# Patient Record
Sex: Male | Born: 1970
Health system: Southern US, Community
[De-identification: ages and names within clinical notes are randomized; demographics above are authoritative.]

## PROBLEM LIST (undated history)

## (undated) DIAGNOSIS — H349 Unspecified retinal vascular occlusion: Secondary | ICD-10-CM

## (undated) DIAGNOSIS — Z8701 Personal history of pneumonia (recurrent): Secondary | ICD-10-CM

## (undated) DIAGNOSIS — R7401 Elevation of levels of liver transaminase levels: Secondary | ICD-10-CM

## (undated) DIAGNOSIS — N486 Induration penis plastica: Secondary | ICD-10-CM

## (undated) DIAGNOSIS — G4700 Insomnia, unspecified: Secondary | ICD-10-CM

## (undated) DIAGNOSIS — J309 Allergic rhinitis, unspecified: Secondary | ICD-10-CM

## (undated) DIAGNOSIS — R0789 Other chest pain: Secondary | ICD-10-CM

## (undated) DIAGNOSIS — G43909 Migraine, unspecified, not intractable, without status migrainosus: Secondary | ICD-10-CM

## (undated) DIAGNOSIS — F4329 Adjustment disorder with other symptoms: Secondary | ICD-10-CM

## (undated) DIAGNOSIS — R002 Palpitations: Secondary | ICD-10-CM

## (undated) DIAGNOSIS — J189 Pneumonia, unspecified organism: Secondary | ICD-10-CM

## (undated) HISTORY — DX: Personal history of pneumonia (recurrent): Z87.01

## (undated) HISTORY — DX: Gilbert syndrome: E80.4

## (undated) HISTORY — DX: Elevation of levels of liver transaminase levels: R74.01

## (undated) HISTORY — DX: Allergic rhinitis, unspecified: J30.9

## (undated) HISTORY — DX: Pneumonia, unspecified organism: J18.9

## (undated) HISTORY — PX: ABDOMINAL ADHESION SURGERY: SHX90

## (undated) HISTORY — DX: Palpitations: R00.2

## (undated) HISTORY — DX: Migraine, unspecified, not intractable, without status migrainosus: G43.909

## (undated) HISTORY — DX: Adjustment disorder with other symptoms: F43.29

## (undated) HISTORY — DX: Induration penis plastica: N48.6

## (undated) HISTORY — DX: Insomnia, unspecified: G47.00

## (undated) HISTORY — DX: Unspecified retinal vascular occlusion: H34.9

---
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
## 1994-09-29 HISTORY — PX: EXPLORATORY LAPAROTOMY: SUR591

## 2004-02-20 ENCOUNTER — Inpatient Hospital Stay (HOSPITAL_COMMUNITY): Admission: EM | Admit: 2004-02-20 | Discharge: 2004-02-22 | Payer: Self-pay | Admitting: Emergency Medicine

## 2005-10-30 ENCOUNTER — Emergency Department (HOSPITAL_COMMUNITY): Admission: EM | Admit: 2005-10-30 | Discharge: 2005-10-30 | Payer: Self-pay | Admitting: Emergency Medicine

## 2006-07-04 ENCOUNTER — Emergency Department (HOSPITAL_COMMUNITY): Admission: EM | Admit: 2006-07-04 | Discharge: 2006-07-04 | Payer: Self-pay | Admitting: Emergency Medicine

## 2007-09-30 HISTORY — PX: CHOLECYSTECTOMY: SHX55

## 2008-04-11 ENCOUNTER — Emergency Department (HOSPITAL_BASED_OUTPATIENT_CLINIC_OR_DEPARTMENT_OTHER): Admission: EM | Admit: 2008-04-11 | Discharge: 2008-04-11 | Payer: Self-pay | Admitting: Emergency Medicine

## 2008-05-01 ENCOUNTER — Ambulatory Visit (HOSPITAL_COMMUNITY): Admission: RE | Admit: 2008-05-01 | Discharge: 2008-05-01 | Payer: Self-pay | Admitting: General Surgery

## 2008-05-01 ENCOUNTER — Encounter (INDEPENDENT_AMBULATORY_CARE_PROVIDER_SITE_OTHER): Payer: Self-pay | Admitting: General Surgery
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
## 2011-02-22 ENCOUNTER — Inpatient Hospital Stay
Admission: RE | Admit: 2011-02-22 | Discharge: 2011-02-22 | Disposition: A | Discharge status: Home / Self Care | Payer: BC Managed Care – PPO | Source: Ambulatory Visit | Attending: Family Medicine | Admitting: Family Medicine

## 2011-06-26 LAB — COMPREHENSIVE METABOLIC PANEL
ALT: 87 — ABNORMAL HIGH
Alkaline Phosphatase: 57
BUN: 17
CO2: 29
Calcium: 10.9 — ABNORMAL HIGH
GFR calc non Af Amer: >60
Glucose, Bld: 108 — ABNORMAL HIGH
Potassium: 3.9
Sodium: 145
Total Protein: 7.2

## 2011-06-26 LAB — CBC
HCT: 41.6
Platelets: 183
RDW: 12.1
WBC: 7.7

## 2011-06-26 LAB — URINALYSIS, ROUTINE W REFLEX MICROSCOPIC
Bilirubin Urine: NEGATIVE
Hgb urine dipstick: NEGATIVE
Ketones, ur: NEGATIVE
Nitrite: NEGATIVE
Urobilinogen, UA: 1

## 2011-06-26 LAB — LIPASE, BLOOD: Lipase: 64

## 2011-06-26 LAB — DIFFERENTIAL
Basophils Absolute: 0
Lymphocytes Relative: 15
Lymphs Abs: 1.1
Neutro Abs: 6.1

## 2011-09-09 ENCOUNTER — Ambulatory Visit (INDEPENDENT_AMBULATORY_CARE_PROVIDER_SITE_OTHER): Payer: BC Managed Care – PPO | Admitting: Family Medicine

## 2011-09-09 ENCOUNTER — Encounter: Payer: Self-pay | Admitting: Family Medicine

## 2011-09-09 VITALS — BP 119/77 | HR 87 | Temp 98.7°F | Ht 74.0 in | Wt 207.0 lb

## 2011-09-09 DIAGNOSIS — J209 Acute bronchitis, unspecified: Secondary | ICD-10-CM

## 2011-09-09 DIAGNOSIS — M62830 Muscle spasm of back: Secondary | ICD-10-CM

## 2011-09-09 DIAGNOSIS — Z23 Encounter for immunization: Secondary | ICD-10-CM

## 2011-09-09 DIAGNOSIS — M5380 Other specified dorsopathies, site unspecified: Secondary | ICD-10-CM

## 2011-09-09 MED ORDER — ALBUTEROL SULFATE HFA 108 (90 BASE) MCG/ACT IN AERS: 2.0000 | INHALATION_SPRAY | Freq: Four times a day (QID) | RESPIRATORY_TRACT | Status: DC | PRN

## 2011-09-09 MED ORDER — ALBUTEROL SULFATE (2.5 MG/3ML) 0.083% IN NEBU: 2.5000 mg | INHALATION_SOLUTION | RESPIRATORY_TRACT | Status: DC

## 2011-09-09 MED ORDER — CYCLOBENZAPRINE HCL 10 MG PO TABS: 10.0000 mg | ORAL_TABLET | Freq: Three times a day (TID) | ORAL | Status: AC | PRN

## 2011-09-09 MED ORDER — BENZONATATE 200 MG PO CAPS: 200.0000 mg | ORAL_CAPSULE | Freq: Three times a day (TID) | ORAL | Status: AC | PRN

## 2011-09-09 MED ORDER — PREDNISONE 20 MG PO TABS: ORAL_TABLET | ORAL | Status: DC

## 2011-09-09 MED ORDER — HYDROCODONE-HOMATROPINE 5-1.5 MG/5ML PO SYRP: ORAL_SOLUTION | ORAL | Status: DC

## 2011-09-09 NOTE — Assessment & Plan Note (Signed)
Prednisone 40mg  qd x 5d, then 20mg  qd x 5d. Flexeril 10mg  q8h prn for back muscle spasms. Hycodan susp 1-2 tsp q6h prn severe cough.  Tessalon 200mg  q8h prn if can't use hycodan in daytime. ProAir HFA 2 puffs q6h prn. F/u in office 6d.
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
## 2011-09-15 ENCOUNTER — Encounter: Payer: Self-pay | Admitting: Family Medicine

## 2011-09-15 ENCOUNTER — Ambulatory Visit (INDEPENDENT_AMBULATORY_CARE_PROVIDER_SITE_OTHER): Payer: BC Managed Care – PPO | Admitting: Family Medicine

## 2011-09-15 VITALS — BP 114/78 | HR 90 | Wt 208.0 lb

## 2011-09-15 DIAGNOSIS — J209 Acute bronchitis, unspecified: Secondary | ICD-10-CM

## 2011-09-15 MED ORDER — HYDROCODONE-HOMATROPINE 5-1.5 MG/5ML PO SYRP: ORAL_SOLUTION | ORAL | Status: AC

## 2011-09-15 NOTE — Assessment & Plan Note (Signed)
Improving gradually. Continue steroid until finished. Continue symptomatic care (add mucinex OTC as expectorant). If gradual improvement continues then no further f/u needed with me.  If improvement stagnates or he has set backs then he should return for recheck. Hycodan RF x 1 given today.

## 2011-09-15 NOTE — Progress Notes (Signed)
OFFICE NOTE  09/15/2011  CC: No chief complaint on file.    HPI: Patient is a 40 y.o. Caucasian male who is here for f/u bronchitis. Feeling better.  When cough suppressant wears off he has more coughing and more tendency for back muscle spasm to occur. No fevers.  No wheezing or SOB.  He is not sure if the albuterol MDI helps.  Appetite ok.  No side effects from meds, no sedation.  Pertinent PMH:  No hx of tob abuse or asthma  Past medical, surgical, family hx, and social history reviewed and there are no changes since the patient's last office visit with me.  Pertinent Meds:  Prednisone, amoxil, flexeril, hycodan, tessalon perles, Proair HFA  PE: Blood pressure 114/78, pulse 90, weight 208 lb (94.348 kg), SpO2 99.00%. Gen: Alert, well appearing.  Patient is oriented to person, place, time, and situation. ENT: Eyes: no injection, icteris, swelling, or exudate.  EOMI, PERRLA. Nose: no drainage or turbinate edema/swelling.  No injection or focal lesion.  Mouth: lips without lesion/swelling.  Oral mucosa pink and moist.  Dentition intact and without obvious caries or gingival swelling.  Oropharynx without erythema, exudate, or swelling.  Neck - No masses or thyromegaly or limitation in range of motion CV: RRR, no m/r/g.   LUNGS: CTA bilat, nonlabored resps, good aeration in all lung fields. ABD: soft, ND, NT. EXT: no clubbing, cyanosis, or edema.   IMPRESSION AND PLAN: Acute bronchitis Improving gradually. Continue steroid until finished. Continue symptomatic care (add mucinex OTC as expectorant). If gradual improvement continues then no further f/u needed with me.  If improvement stagnates or he has set backs then he should return for recheck. Hycodan RF x 1 given today.      FOLLOW UP: prn

## 2011-09-16 NOTE — Progress Notes (Signed)
Addended by: Luisa Dago on: 09/16/2011 08:07 AM   Modules accepted: Orders

## 2012-01-28 ENCOUNTER — Telehealth: Payer: Self-pay | Admitting: Family Medicine

## 2012-01-28 DIAGNOSIS — H349 Unspecified retinal vascular occlusion: Secondary | ICD-10-CM

## 2012-01-28 HISTORY — DX: Unspecified retinal vascular occlusion: H34.9

## 2012-01-28 NOTE — Telephone Encounter (Signed)
Received call from Dr. Stephannie Li, ophthalmologist, regarding this pt being dx'd with retinal artery occlusion today. Dr. Allyne Gee did not see any embolus, and he had strong suspicion that this was a vasculitic process and not an embolic one. Nevertheless, we agreed that a cardiac echo would be appropriate and he will send over a list of labs with the patient that we'll draw. Will place a PPD on him as well.  Will have him f/u in office w/me in the next 7-10d.--PM

## 2012-01-29 ENCOUNTER — Other Ambulatory Visit: Payer: Self-pay | Admitting: Family Medicine

## 2012-01-29 ENCOUNTER — Other Ambulatory Visit: Payer: BC Managed Care – PPO

## 2012-01-29 DIAGNOSIS — H34239 Retinal artery branch occlusion, unspecified eye: Secondary | ICD-10-CM | POA: Insufficient documentation

## 2012-01-29 LAB — CBC
MCH: 29.6 pg (ref 26.0–34.0)
MCHC: 34.7 g/dL (ref 30.0–36.0)
MCV: 85.2 fL (ref 78.0–100.0)
Platelets: 249 10*3/uL (ref 150–400)
RBC: 5.34 MIL/uL (ref 4.22–5.81)
RDW: 12.3 % (ref 11.5–15.5)
WBC: 15.4 10*3/uL — ABNORMAL HIGH (ref 4.0–10.5)

## 2012-01-29 LAB — COMPREHENSIVE METABOLIC PANEL
AST: 18 U/L (ref 0–37)
Albumin: 4.7 g/dL (ref 3.5–5.2)
BUN: 18 mg/dL (ref 6–23)
Calcium: 10.1 mg/dL (ref 8.4–10.5)
Chloride: 105 mEq/L (ref 96–112)
Glucose, Bld: 101 mg/dL — ABNORMAL HIGH (ref 70–99)
Potassium: 4.5 mEq/L (ref 3.5–5.3)
Total Protein: 7 g/dL (ref 6.0–8.3)

## 2012-01-29 LAB — HIV ANTIBODY (ROUTINE TESTING W REFLEX): HIV: NONREACTIVE

## 2012-01-30 HISTORY — PX: TRANSTHORACIC ECHOCARDIOGRAM: SHX275

## 2012-02-02 ENCOUNTER — Encounter: Payer: Self-pay | Admitting: Family Medicine

## 2012-02-04 ENCOUNTER — Ambulatory Visit (INDEPENDENT_AMBULATORY_CARE_PROVIDER_SITE_OTHER): Payer: BC Managed Care – PPO | Admitting: *Deleted

## 2012-02-04 DIAGNOSIS — Z111 Encounter for screening for respiratory tuberculosis: Secondary | ICD-10-CM

## 2012-02-04 NOTE — Progress Notes (Signed)
Pt presents for PPD placement.  This is placed on left forearm.

## 2012-02-06 ENCOUNTER — Other Ambulatory Visit: Payer: Self-pay | Admitting: Family Medicine

## 2012-02-06 ENCOUNTER — Ambulatory Visit (INDEPENDENT_AMBULATORY_CARE_PROVIDER_SITE_OTHER): Payer: BC Managed Care – PPO | Admitting: Family Medicine

## 2012-02-06 ENCOUNTER — Encounter: Payer: Self-pay | Admitting: *Deleted

## 2012-02-06 ENCOUNTER — Encounter: Payer: Self-pay | Admitting: Family Medicine

## 2012-02-06 VITALS — BP 124/82 | HR 79 | Temp 98.8°F | Ht 74.0 in | Wt 215.0 lb

## 2012-02-06 DIAGNOSIS — H34239 Retinal artery branch occlusion, unspecified eye: Secondary | ICD-10-CM

## 2012-02-06 DIAGNOSIS — R519 Headache, unspecified: Secondary | ICD-10-CM

## 2012-02-06 DIAGNOSIS — R51 Headache: Secondary | ICD-10-CM

## 2012-02-06 DIAGNOSIS — G43009 Migraine without aura, not intractable, without status migrainosus: Secondary | ICD-10-CM

## 2012-02-06 DIAGNOSIS — J019 Acute sinusitis, unspecified: Secondary | ICD-10-CM

## 2012-02-06 DIAGNOSIS — F419 Anxiety disorder, unspecified: Secondary | ICD-10-CM

## 2012-02-06 DIAGNOSIS — F411 Generalized anxiety disorder: Secondary | ICD-10-CM

## 2012-02-06 LAB — TB SKIN TEST: Induration: 0

## 2012-02-06 MED ORDER — FLUTICASONE PROPIONATE 50 MCG/ACT NA SUSP: 2.0000 | Freq: Every day | NASAL | Status: DC

## 2012-02-06 MED ORDER — BUTALBITAL-ACETAMINOPHEN 50-650 MG PO TABS: ORAL_TABLET | ORAL | Status: DC

## 2012-02-06 MED ORDER — CLONAZEPAM 1 MG PO TABS: 1.0000 mg | ORAL_TABLET | Freq: Two times a day (BID) | ORAL | Status: DC | PRN

## 2012-02-06 MED ORDER — AMOXICILLIN 875 MG PO TABS: 875.0000 mg | ORAL_TABLET | Freq: Two times a day (BID) | ORAL | Status: AC

## 2012-02-06 NOTE — Progress Notes (Signed)
OFFICE NOTE  02/06/2012  CC:  Chief Complaint  Patient presents with  . Follow-up     HPI: Patient is a 41 y.o. Caucasian male who is here with his wife today for f/u of recently detected branch retinal artery occlusion by his ophthalmologist, Dr. Allyne Gee.  He has a list of labs from ophtho again today. Recent labs and echocardiogram with bubble study have been normal. Dr. Allyne Gee started him on high dose prednisone (80mg  to start) and has weened him down a bit quicker than planned b/c pt not tolerating the med well: palpitations, insomnia, lightheaded feeling, vague numbness and tingling in legs, feeling anxious.  Also, c/o more HA's since getting on prednisone and since his vision impairment has been present: central forehead hurts after about 1 hour of work at his computer, feels light sensitivity, nausea without vomiting. Also has had recent prolonged URI sx's without significant cough, without fevers.    ROS: no pain in temples, no rash, no myalgias or arthralgias, no constipation or diarrhea, no dysuria/hematuria/frequency.   Pertinent PMH:  Past Medical History  Diagnosis Date  . Retinal artery occlusion 01/2012   Past surgical, social, and family history reviewed and no changes noted since last office visit.   MEDS:  Outpatient Prescriptions Prior to Visit  Medication Sig Dispense Refill  . predniSONE (DELTASONE) 20 MG tablet 2 tabs po qd x 5d, then 1 tab po qd x 5d  15 tablet  0  . albuterol (PROAIR HFA) 108 (90 BASE) MCG/ACT inhaler Inhale 2 puffs into the lungs every 6 (six) hours as needed for wheezing.  1 Inhaler  0  Advile prn, benadryl prn  PE: Blood pressure 124/82, pulse 79, temperature 98.8 F (37.1 C), temperature source Temporal, height 6\' 2"  (1.88 m), weight 215 lb (97.523 kg), SpO2 100.00%. VS: noted--normal. Gen: alert, NAD, NONTOXIC APPEARING. HEENT: eyes without injection, drainage, or swelling.  Ears: EACs clear, TMs with normal light reflex and  landmarks.  Nose: Clear rhinorrhea, with some dried, crusty exudate adherent to mildly injected mucosa.  No purulent d/c.  No paranasal sinus TTP.  No facial swelling.  Throat and mouth without focal lesion.  No pharyngial swelling, erythema, or exudate.   Neck: supple, no LAD.   LUNGS: CTA bilat, nonlabored resps.   CV: RRR, no m/r/g. EXT: no c/c/e SKIN: no rash  IMPRESSION AND PLAN:  Retinal artery occlusion, branch No clear etiology. Vasculitis w/u ongoing, some hypercoagulability testing has been ordered by his ophthalmologist and we've drawn blood for these today. Will order carotid dopplers and holter monitor to further eval/rule out cardioembolic source (echo normal). Pt's vision is apparently not changed since seeing the ophthalmologist and getting on high dose steroids. Will discuss with Dr. Allyne Gee.  Headache in front of head Likely mixed tension/migraine/med side effect: will avoid triptans at this time. Will try cephadyn 50/650, 1-2 tabs q6h prn HA.  Sinusitis acute Amoxil 875mg  bid x 10d.  Flonase daily. Trial of mucinex DM or robitussin DM otc as directed on the box. May use OTC nasal saline spray or irrigation solution bid. OTC nonsedating antihistamines prn discussed.  Decongestant use discussed--ok if tolerated in the past w/out side effect and if pt has no hx of HTN.   Anxiety Anxiety and insomnia: multifactorial (current vision loss + prednisone side effects. Trial of clonazepam bid prn.      FOLLOW UP: 2 wks

## 2012-02-07 LAB — HOMOCYSTEINE: Homocysteine: 11.1 umol/L (ref 4.0–15.4)

## 2012-02-09 LAB — LUPUS ANTICOAGULANT PANEL: DRVVT: 35.6 secs (ref <45.1)

## 2012-02-10 ENCOUNTER — Other Ambulatory Visit: Payer: Self-pay | Admitting: Family Medicine

## 2012-02-10 DIAGNOSIS — H34239 Retinal artery branch occlusion, unspecified eye: Secondary | ICD-10-CM

## 2012-02-10 DIAGNOSIS — J019 Acute sinusitis, unspecified: Secondary | ICD-10-CM | POA: Insufficient documentation

## 2012-02-10 DIAGNOSIS — F419 Anxiety disorder, unspecified: Secondary | ICD-10-CM | POA: Insufficient documentation

## 2012-02-10 DIAGNOSIS — R519 Headache, unspecified: Secondary | ICD-10-CM | POA: Insufficient documentation

## 2012-02-10 LAB — CARDIOLIPIN ANTIBODIES, IGG, IGM, IGA: Anticardiolipin IgA: 2 APL U/mL (ref <22)

## 2012-02-10 NOTE — Assessment & Plan Note (Addendum)
Amoxil 875mg  bid x 10d.  Flonase daily. Trial of mucinex DM or robitussin DM otc as directed on the box. May use OTC nasal saline spray or irrigation solution bid. OTC nonsedating antihistamines prn discussed.  Decongestant use discussed--ok if tolerated in the past w/out side effect and if pt has no hx of HTN.

## 2012-02-10 NOTE — Assessment & Plan Note (Signed)
Anxiety and insomnia: multifactorial (current vision loss + prednisone side effects. Trial of clonazepam bid prn.

## 2012-02-10 NOTE — Assessment & Plan Note (Signed)
No clear etiology. Vasculitis w/u ongoing, some hypercoagulability testing has been ordered by his ophthalmologist and we've drawn blood for these today. Will order carotid dopplers and holter monitor to further eval/rule out cardioembolic source (echo normal). Pt's vision is apparently not changed since seeing the ophthalmologist and getting on high dose steroids. Will discuss with Dr. Allyne Gee.

## 2012-02-10 NOTE — Assessment & Plan Note (Signed)
Likely mixed tension/migraine/med side effect: will avoid triptans at this time. Will try cephadyn 50/650, 1-2 tabs q6h prn HA.

## 2012-02-11 ENCOUNTER — Telehealth: Payer: Self-pay

## 2012-02-11 NOTE — Telephone Encounter (Signed)
Patient call back to make appt for 02/17/2012

## 2012-02-20 ENCOUNTER — Encounter: Payer: Self-pay | Admitting: Family Medicine

## 2012-02-20 ENCOUNTER — Ambulatory Visit (INDEPENDENT_AMBULATORY_CARE_PROVIDER_SITE_OTHER): Payer: BC Managed Care – PPO | Admitting: Family Medicine

## 2012-02-20 VITALS — BP 116/78 | HR 80 | Temp 97.7°F | Ht 74.0 in | Wt 217.0 lb

## 2012-02-20 DIAGNOSIS — I749 Embolism and thrombosis of unspecified artery: Secondary | ICD-10-CM

## 2012-02-20 DIAGNOSIS — J019 Acute sinusitis, unspecified: Secondary | ICD-10-CM

## 2012-02-20 DIAGNOSIS — H34239 Retinal artery branch occlusion, unspecified eye: Secondary | ICD-10-CM

## 2012-02-20 DIAGNOSIS — F411 Generalized anxiety disorder: Secondary | ICD-10-CM

## 2012-02-20 DIAGNOSIS — F419 Anxiety disorder, unspecified: Secondary | ICD-10-CM

## 2012-02-20 MED ORDER — TRAMADOL HCL 50 MG PO TABS: 50.0000 mg | ORAL_TABLET | Freq: Four times a day (QID) | ORAL | Status: AC | PRN

## 2012-02-20 NOTE — Patient Instructions (Signed)
Avoid use of motrin, advil, ibuprofen, naproxen, alleve, BC powder, goodies powder--essentially ANY OTC pain med except plain acetaminophen/Tylenol.

## 2012-02-20 NOTE — Progress Notes (Signed)
Addended by: Baldemar Lenis R on: 02/20/2012 10:29 AM   Modules accepted: Orders

## 2012-02-20 NOTE — Progress Notes (Signed)
OFFICE NOTE  02/20/2012  CC:  Chief Complaint  Patient presents with  . Follow-up    anxiety, HA, sinusitis; has order for more labs from [facility]     HPI: Patient is a 41 y.o. Caucasian male who is here for follow up of branch retinal artery occlusion. Since being off steroids pt feels better except HA's in forehead area.  Much of his sinus sx's have resolved soon after getting on abx.  Feels neck tightness and tension and some HA's occur in occipital area--strains to see frequently when working due to right eye damage. Saw [facility] MD team 2 d/a and they have ordered some additional testing for [place] to do here in our lab--ANCA, HLA B51, Hb electrophoresis, treponema pallidum confirmatory testing.  Also want an MRI brain with contrast to evaluate for demyelinating disease. They apparently told him that there may be a laser treatment available to him in the near future after all w/u is done and depending on what adjustments his eyes/brain make over the next few months in response to his injury.  He has been told not to fly.   Pertinent PMH:  Past Medical History  Diagnosis Date  . Retinal artery occlusion 01/2012  Mitral valve prolapse, with mild mitral valve regurgitation noted on ECHO 01/2012.  MEDS:  ASA 325mg  qd currently  PE: Blood pressure 116/78, pulse 80, temperature 97.7 F (36.5 C), temperature source Temporal, height 6\' 2"  (1.88 m), weight 217 lb (98.431 kg). Gen: Alert, well appearing.  Patient is oriented to person, place, time, and situation. Pupils equal and reactive.  EOMI.  Visual field testing shows right eye deficit inferonasally. ENT: Ears: EACs clear, normal epithelium.  TMs with good light reflex and landmarks bilaterally.  Nose: no drainage or turbinate edema/swelling.  No injection or focal lesion.  Mouth: lips without lesion/swelling.  Oral mucosa pink and moist.  Dentition intact and without obvious caries or gingival swelling.  Oropharynx without erythema,  exudate, or swelling.  Neck: supple/nontender.  No LAD, mass, or TM.  Carotid pulses 2+ bilaterally, without bruits. CV: RRR, no m/r/g.   LUNGS: CTA bilat, nonlabored resps, good aeration in all lung fields.  LABS: none today  IMPRESSION AND PLAN:  Retinal artery occlusion, branch Stable. Will do [facility] labs and MRI brain w/contrast as per their recent request--except the PPD b/c we have already done this and it was negative and I see no reason to repeat it.  Will reschedule the carotid dopplers we planned for him and also schedule an event monitor to try to capture asymptomatic arrhythmia that may be leading to embolic episodes. Continue on 325mg  ASA, avoid NSAIDs. He is having some forehead/peri-orbital/neck straining from the loss of vision and this is leading to recurrent nagging HA's.   The butalbital/APAP was no help, so we'll try tramadol 50mg  + tylenol 500mg  q6h prn HA.  He is not a big medication taker and will likely use this very infrequently.    Sinusitis acute Resolved.  Essentially has mild allergic rhinitis sx's leftover.  Encouraged him to start the Flonase I rx'd for him last visit and maybe this will help prevent some of his HA's that he's having lately.  Anxiety Essentially resolved off of his high dose steroids. Getting back into his exercise routine should be helpful for this, too. He is not taking any clonazepam.    FOLLOW UP: 1 mo

## 2012-02-20 NOTE — Assessment & Plan Note (Addendum)
Stable. Will do [facility] labs and MRI brain w/contrast as per their recent request--except the PPD b/c we have already done this and it was negative and I see no reason to repeat it.  Will reschedule the carotid dopplers we planned for him and also schedule an event monitor to try to capture asymptomatic arrhythmia that may be leading to embolic episodes. Continue on 325mg  ASA, avoid NSAIDs. He is having some forehead/peri-orbital/neck straining from the loss of vision and this is leading to recurrent nagging HA's.   The butalbital/APAP was no help, so we'll try tramadol 50mg  + tylenol 500mg  q6h prn HA.  He is not a big medication taker and will likely use this very infrequently.

## 2012-02-20 NOTE — Assessment & Plan Note (Signed)
Resolved.  Essentially has mild allergic rhinitis sx's leftover.  Encouraged him to start the Flonase I rx'd for him last visit and maybe this will help prevent some of his HA's that he's having lately.

## 2012-02-20 NOTE — Assessment & Plan Note (Signed)
Essentially resolved off of his high dose steroids. Getting back into his exercise routine should be helpful for this, too. He is not taking any clonazepam.

## 2012-02-24 ENCOUNTER — Encounter (INDEPENDENT_AMBULATORY_CARE_PROVIDER_SITE_OTHER): Payer: BC Managed Care – PPO

## 2012-02-24 DIAGNOSIS — H34239 Retinal artery branch occlusion, unspecified eye: Secondary | ICD-10-CM

## 2012-02-24 DIAGNOSIS — I498 Other specified cardiac arrhythmias: Secondary | ICD-10-CM

## 2012-02-24 DIAGNOSIS — I749 Embolism and thrombosis of unspecified artery: Secondary | ICD-10-CM

## 2012-02-24 LAB — HEMOGLOBINOPATHY EVALUATION
Hgb A: 97.2 % (ref 96.8–97.8)
Hgb F Quant: 0 % (ref 0.0–2.0)
Hgb S Quant: 0 %

## 2012-02-24 LAB — ANCA SCREEN W REFLEX TITER
Atypical p-ANCA Screen: NEGATIVE
p-ANCA Screen: NEGATIVE

## 2012-02-26 ENCOUNTER — Other Ambulatory Visit: Payer: Self-pay | Admitting: Family Medicine

## 2012-02-26 ENCOUNTER — Other Ambulatory Visit: Payer: Self-pay | Admitting: Internal Medicine

## 2012-02-26 DIAGNOSIS — H34239 Retinal artery branch occlusion, unspecified eye: Secondary | ICD-10-CM

## 2012-02-28 ENCOUNTER — Other Ambulatory Visit (HOSPITAL_BASED_OUTPATIENT_CLINIC_OR_DEPARTMENT_OTHER): Payer: BC Managed Care – PPO

## 2012-02-28 ENCOUNTER — Ambulatory Visit (HOSPITAL_BASED_OUTPATIENT_CLINIC_OR_DEPARTMENT_OTHER)
Admission: RE | Admit: 2012-02-28 | Discharge: 2012-02-28 | Disposition: A | Discharge status: Home / Self Care | Payer: BC Managed Care – PPO | Source: Ambulatory Visit | Attending: Family Medicine | Admitting: Family Medicine

## 2012-02-28 DIAGNOSIS — H34239 Retinal artery branch occlusion, unspecified eye: Secondary | ICD-10-CM

## 2012-02-28 DIAGNOSIS — H5450 Low vision, one eye, unspecified eye: Secondary | ICD-10-CM | POA: Insufficient documentation

## 2012-03-01 ENCOUNTER — Encounter (INDEPENDENT_AMBULATORY_CARE_PROVIDER_SITE_OTHER): Payer: BC Managed Care – PPO

## 2012-03-01 DIAGNOSIS — R42 Dizziness and giddiness: Secondary | ICD-10-CM

## 2012-03-01 DIAGNOSIS — H349 Unspecified retinal vascular occlusion: Secondary | ICD-10-CM

## 2012-03-01 DIAGNOSIS — H34239 Retinal artery branch occlusion, unspecified eye: Secondary | ICD-10-CM

## 2012-03-04 ENCOUNTER — Telehealth: Payer: Self-pay | Admitting: Family Medicine

## 2012-03-04 NOTE — Telephone Encounter (Signed)
Please advise 

## 2012-03-05 ENCOUNTER — Other Ambulatory Visit: Payer: Self-pay | Admitting: Family Medicine

## 2012-03-05 MED ORDER — CLOPIDOGREL BISULFATE 75 MG PO TABS: 75.0000 mg | ORAL_TABLET | Freq: Every day | ORAL | Status: DC

## 2012-03-08 ENCOUNTER — Telehealth: Payer: Self-pay | Admitting: Family Medicine

## 2012-03-08 NOTE — Telephone Encounter (Signed)
I spoke with Dr. Modesto Charon, [facility] Neurologist, regarding patient's recent abnormal MRI. Dr. Modesto Charon was happy to see Henry [name] ASAP, so I will contact patient and we'll get this set up.--PM

## 2012-03-08 NOTE — Telephone Encounter (Signed)
I spoke with Henry [name], Dr. Moises Blood secretary, and she said he'll be out of town all this week and will return next Tuesday.  She pass on the message to call me about Henry [name]'s case/labs/MRI result, etc.--PM

## 2012-03-09 NOTE — Telephone Encounter (Signed)
I have talked with pt--see phone note.--PM

## 2012-03-16 ENCOUNTER — Encounter: Payer: Self-pay | Admitting: Neurology

## 2012-03-16 ENCOUNTER — Ambulatory Visit (INDEPENDENT_AMBULATORY_CARE_PROVIDER_SITE_OTHER): Payer: BC Managed Care – PPO | Admitting: Neurology

## 2012-03-16 VITALS — BP 128/80 | HR 72 | Wt 220.0 lb

## 2012-03-16 DIAGNOSIS — I6350 Cerebral infarction due to unspecified occlusion or stenosis of unspecified cerebral artery: Secondary | ICD-10-CM

## 2012-03-16 DIAGNOSIS — I639 Cerebral infarction, unspecified: Secondary | ICD-10-CM
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
## 2012-03-16 NOTE — Patient Instructions (Addendum)
Your MRI is scheduled for Tuesday, Sept 3rd at 10:00am.  [facility] [phone].

## 2012-03-19 ENCOUNTER — Encounter: Payer: Self-pay | Admitting: Family Medicine

## 2012-03-19 ENCOUNTER — Ambulatory Visit (INDEPENDENT_AMBULATORY_CARE_PROVIDER_SITE_OTHER): Payer: BC Managed Care – PPO | Admitting: Family Medicine

## 2012-03-19 ENCOUNTER — Other Ambulatory Visit: Payer: Self-pay

## 2012-03-19 ENCOUNTER — Ambulatory Visit (INDEPENDENT_AMBULATORY_CARE_PROVIDER_SITE_OTHER)
Admission: RE | Admit: 2012-03-19 | Discharge: 2012-03-19 | Disposition: A | Discharge status: Home / Self Care | Payer: BC Managed Care – PPO | Source: Ambulatory Visit | Attending: Family Medicine | Admitting: Family Medicine

## 2012-03-19 ENCOUNTER — Other Ambulatory Visit: Payer: Self-pay | Admitting: Family Medicine

## 2012-03-19 VITALS — BP 110/73 | HR 75 | Temp 97.2°F | Ht 74.0 in | Wt 221.0 lb

## 2012-03-19 DIAGNOSIS — I776 Arteritis, unspecified: Secondary | ICD-10-CM

## 2012-03-19 DIAGNOSIS — H34239 Retinal artery branch occlusion, unspecified eye: Secondary | ICD-10-CM

## 2012-03-19 NOTE — Progress Notes (Signed)
OFFICE NOTE  03/19/2012  CC:  Chief Complaint  Patient presents with  . Follow-up    Arterial branch occlusion of retina; headaches better     HPI: Patient is a 41 y.o. Caucasian male who is here for 1 mo f/u visit for branch retinal artery occlusion. He still has the same vision loss and he suffers from frequent tension HA's from straining. He has been abstaining from vigorous physical activity mainly out of fear, not b/c we have restricted him. He has been wearing a cardiac event monitor.  He denies feeling any palpitations, dizziness, presyncope, or chest pains or SOB. I told him that I have been in touch with his eye specialist at [facility] (Dr. Everlena Cooper) and his neurologist (Dr. Modesto Charon) about their workup and impressions. We discussed the status so far of all the testing and specialist evaluations he has gone through and essentially all testing has been unrevealing except an MRI that showed some mineralization of the globus pallidus and a lacunar infarct in the thalamus region.  The neurologist (Dr. 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  They are less convinced about any thromboembolic phenomena causing this.  However, I told Mr. Donzetta Sprung that Dr. Modesto Charon still recommended a TEE to r/o any patent foramen ovale or small valvular vegetation that could be present.    Mr. 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  It is intermittent, lasts <30 sec.  He denies any hearing loss that he can detect. I tried to answer Mr. Corradi questions as accurately as I could today.   Pertinent PMH:  Past Medical History  Diagnosis Date  . Retinal artery occlusion 01/2012    MEDS:  ASA 325mg  qd, Klonopin 1mg  qhs prn, butalbital/APAP q6h prn  HA, flonase qd  PE: Blood pressure 110/73, pulse 75, temperature 97.2 F (36.2 C), temperature source Temporal, height 6\' 2"  (1.88 m), weight 221 lb (100.245 kg). Gen: Alert, well appearing.  Patient is oriented to person, place, time, and situation. ENT: Ears: EACs clear, normal epithelium.  TMs with good light reflex and landmarks bilaterally.  Eyes: no injection, icteris, swelling, or exudate.  EOMI, PERRLA. Nose: no drainage or turbinate edema/swelling.  No injection or focal lesion.  Mouth: lips without lesion/swelling.  Oral mucosa pink and moist.  Dentition intact and without obvious caries or gingival swelling.  Oropharynx without erythema, exudate, or swelling.  CV: RRR, no m/r/g.   LUNGS: CTA bilat, nonlabored resps, good aeration in all lung fields. Neuro: CN 2-12 intact bilaterally, strength 5/5 in proximal and distal upper extremities and lower extremities bilaterally.  No sensory deficits.  No tremor.  No disdiadochokinesis.  No ataxia.  Upper extremity and lower extremity DTRs symmetric.  No pronator drift.   IMPRESSION AND PLAN:  Retinal artery occlusion, branch He is stable, but we still have no good answers for him as to the etiology of this problem. Plan is to do the event monitor as long as he can (he has to go out of town again and he'll take it off at that time; this will be at about the 3 wk mark of wearing it).  We'll set up formal audiometry testing. We'll check a CXR (routine part of w/u, recommended  by Dr. Everlena Cooper at [facility]).  We'll repeat his CRP since his initial one was done while he was on systemic steroids. Continue ASA 325mg  for now, no Plavix.  Will hold off on TEE at this time.   FOLLOW UP: 3 mo

## 2012-03-22 NOTE — Assessment & Plan Note (Signed)
He is stable, but we still have no good answers for him as to the etiology of this problem. Plan is to do the event monitor as long as he can (he has to go out of town again and he'll take it off at that time; this will be at about the 3 wk mark of wearing it).  We'll set up formal audiometry testing. We'll check a CXR (routine part of w/u, recommended by Dr. Everlena Cooper at [facility]).  We'll repeat his CRP since his initial one was done while he was on systemic steroids. Continue ASA 325mg  for now, no Plavix.

## 2012-04-08 ENCOUNTER — Ambulatory Visit: Payer: BC Managed Care – PPO | Admitting: Neurology

## 2012-04-26 ENCOUNTER — Encounter: Payer: Self-pay | Admitting: Family Medicine

## 2012-04-30 NOTE — Progress Notes (Signed)
Received notes from Dr. Everlena Cooper at [facility], scanned in system.

## 2012-06-01 ENCOUNTER — Ambulatory Visit (HOSPITAL_BASED_OUTPATIENT_CLINIC_OR_DEPARTMENT_OTHER)
Admission: RE | Admit: 2012-06-01 | Discharge: 2012-06-01 | Disposition: A | Discharge status: Home / Self Care | Payer: BC Managed Care – PPO | Source: Ambulatory Visit | Attending: Neurology | Admitting: Neurology

## 2012-06-01 DIAGNOSIS — R110 Nausea: Secondary | ICD-10-CM | POA: Insufficient documentation

## 2012-06-01 DIAGNOSIS — R51 Headache: Secondary | ICD-10-CM | POA: Insufficient documentation

## 2012-06-01 DIAGNOSIS — I639 Cerebral infarction, unspecified: Secondary | ICD-10-CM

## 2012-06-01 DIAGNOSIS — I6350 Cerebral infarction due to unspecified occlusion or stenosis of unspecified cerebral artery: Secondary | ICD-10-CM | POA: Insufficient documentation

## 2012-06-01 DIAGNOSIS — H5460 Unqualified visual loss, one eye, unspecified: Secondary | ICD-10-CM | POA: Insufficient documentation

## 2012-06-01 DIAGNOSIS — H9319 Tinnitus, unspecified ear: Secondary | ICD-10-CM | POA: Insufficient documentation

## 2012-06-02 ENCOUNTER — Ambulatory Visit (INDEPENDENT_AMBULATORY_CARE_PROVIDER_SITE_OTHER): Payer: BC Managed Care – PPO | Admitting: Neurology

## 2012-06-02 VITALS — BP 106/70 | HR 64 | Wt 226.0 lb

## 2012-06-02 DIAGNOSIS — H349 Unspecified retinal vascular occlusion: Secondary | ICD-10-CM

## 2012-06-02 MED ORDER — AMITRIPTYLINE HCL 25 MG PO TABS: ORAL_TABLET | ORAL | Status: DC

## 2012-06-02 NOTE — Progress Notes (Signed)
Dear Dr. Milinda Cave,   Thank you for having me see Henry [name] in followup today at [facility] for his problem with BRAO and unusual MRI lesion. As you may recall, he is a 41 y.o. year old male with a history of a BRAO OD May 2013. He had been having symptoms of allergic conjunctivitis in the weeks just before that. He had returned from a business trip in [place] a week before. He did have a local alcohol made from sugar cane at that time. He was given steroid drops for his allergic conjunctivitis and then the next day was when he developed the BRAO. He saw Dr. Stephannie Li who drew hypercoagulability panel and rheum workup and then he was referred to Dr. Everlena Cooper at [facility] for second opinion. He ordered an MRI brain which revealed a possible lacunar infarct involving the right caudate head and bilateral T1 hyperintensities involving the dorsal thalami. You ordered an TTE was was basically unremarkable. Carotid dopplers were also normal.   The patient does report some mild bifrontal headaches. He has had some mild ringing in his ears. He says his thinking has not been as clear as well. These are all relatively new since his BRAO.   The patient has used extensive supplements in the past. His latest is green coffee extract. He is unsure if any contain any stimulants. He denies energy drink use.  -------------------------------------------  Since his first visit as outlined above, his headaches have gotten more severe.  They are typically right sided and are accompanied by photophobia.  He is using Tylenol regularly, and tramadol infrequently.  He does have a history of severe migraine headaches as a child.  He is also having problems sleeping because of the headaches.  He has not had any confusion.  An audiogram done was normal.  He still is having tinnitus in his ears.  He has seen Dr. Allyne Gee in follow up, for what sounds like, photocoagulation treatment for neovascularization of his retina after his  BRAO.  His vision has not gotten any better nor has it gotten any worse.  He still reports blurriness in his vision in his right eye, in his nasal visual field, particularly inferiorly.  I repeated an MRI brain just before his appt today.  It was unchanged from previous.   Medical history, social history, and family history were reviewed and have not changed since the last clinic visit.  Current Outpatient Prescriptions on File Prior to Visit  Medication Sig Dispense Refill  . aspirin 325 MG EC tablet Take 325 mg by mouth daily.      . clonazePAM (KLONOPIN) 1 MG tablet Take 1 tablet (1 mg total) by mouth 2 (two) times daily as needed for anxiety.  60 tablet  1  . fluticasone (FLONASE) 50 MCG/ACT nasal spray Place 2 sprays into the nose daily.  16 g  6  . amitriptyline (ELAVIL) 25 MG tablet start with 1/2 tab at night for two weeks, and then increase to 1 tab at night from then on.  30 tablet  3    No Known Allergies  ROS:  13 systems were reviewed and are notable for no fevers, chills, night sweats, weightloss.  All other review of systems are unremarkable.  Exam: . Filed Vitals:   06/02/12 1039  BP: 106/70  Pulse: 64  Weight: 226 lb (102.513 kg)    In general, well appearing man.  Mental status:   The patient is oriented to person, place and time.  Recent and remote memory are intact. Attention span and concentration are normal. Language including repetition, naming, following commands are intact. Fund of knowledge of current and historical events, as well as vocabulary are normal.  Cranial Nerves: Pupils are equally round and reactive to light. Visual fields reveal a right nasal inferior visual field cut. Extraocular movements are intact without nystagmus. Facial sensation and muscles of mastication are intact. Muscles of facial expression are symmetric. Hearing intact to bilateral finger rub. Tongue protrusion, uvula, palate midline.  Shoulder shrug intact  Motor:  Normal bulk  and tone, no drift and 5/5 muscle strength bilaterally.  Reflexes:  2+ thoughout, toes down.  Coordination:  Normal finger to nose  Gait:  Normal gait and station.  Romberg negative.  I reviewed his labs from his hypercoagulability work up and this included CRP, ANA, Lyme, RPR, HIV, TB skin test, Prothrombin gene mutation, Lupus anticoagulant, Protein C and S activity, Cardiolipin ab, homocysteine, ANCA screen, hemoglobinopathy screen.  All were unremarkable except Protein C activity was elevated.  Impression/Recommendations:  1.  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  However, other than the headache he has not clearly evolved clinically, and it is possible the headache is merely related to his previous history of migraines.  I am going to start him on Elavil 25mg  qhs for headaches.  I have instructed him to use naproxen for headaches prn to prevent rebound.  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  While I would be happy to follow him at [facility], I think since he was seen by ophthalmology at [facility], it makes sense for him to return there.  I am going to send him to see Dr. Jerl Mina to get his opinion on the management of his possible Susac's.  I suspect that if he evolves clinically, either with new MRI lesions, hearing loss, encephalopathy or another RAO, then he will need immunosuppression.  Of course, this may represent another type of vasculitis as well.  I will set up the referral to [facility].  Lupita Raider Modesto Charon, MD [facility], [place]

## 2012-06-13 ENCOUNTER — Encounter: Payer: Self-pay | Admitting: Family Medicine

## 2012-06-18 ENCOUNTER — Encounter: Payer: Self-pay | Admitting: Family Medicine

## 2012-06-18 ENCOUNTER — Ambulatory Visit (INDEPENDENT_AMBULATORY_CARE_PROVIDER_SITE_OTHER): Payer: BC Managed Care – PPO | Admitting: Family Medicine

## 2012-06-18 VITALS — BP 119/72 | HR 73 | Ht 74.0 in | Wt 228.0 lb

## 2012-06-18 DIAGNOSIS — G4700 Insomnia, unspecified: Secondary | ICD-10-CM

## 2012-06-18 DIAGNOSIS — Z23 Encounter for immunization: Secondary | ICD-10-CM

## 2012-06-18 DIAGNOSIS — R635 Abnormal weight gain: Secondary | ICD-10-CM

## 2012-06-18 DIAGNOSIS — H34239 Retinal artery branch occlusion, unspecified eye: Secondary | ICD-10-CM

## 2012-06-18 DIAGNOSIS — G43009 Migraine without aura, not intractable, without status migrainosus: Secondary | ICD-10-CM

## 2012-06-18 LAB — TSH: TSH: 0.9 u[IU]/mL (ref 0.35–5.50)

## 2012-06-18 MED ORDER — RIZATRIPTAN BENZOATE 10 MG PO TBDP: 10.0000 mg | ORAL_TABLET | ORAL | Status: DC | PRN

## 2012-06-18 NOTE — Assessment & Plan Note (Signed)
Susac's syndrome as per ophthalmologist at [facility] and Neurologist here (Dr. Modesto Charon). Since Dr. Modesto Charon is going to [facility], he has referred Mr. Henry [name] to a neurologist at [facility] named Dr. Ron Parker.

## 2012-06-18 NOTE — Patient Instructions (Signed)
May take riboflavin (vit B2) 400mg  once daily for migraine prevention (OTC).

## 2012-06-18 NOTE — Assessment & Plan Note (Signed)
Increase amitriptyline to a whole 25mg  tab qhs.  Add riboflavin (vit B2) 400 mg qd OTC. Start trial of maxalt 10mg  prn migraine.  Therapeutic expectations and side effect profile of medication discussed today.  Patient's questions answered.

## 2012-06-18 NOTE — Assessment & Plan Note (Signed)
Hopefull the increase in amitriptyline will help this.  His problem initiating sleep is from HA pain, but his problem with sleep maintenance is not related to HAs.

## 2012-06-18 NOTE — Assessment & Plan Note (Signed)
Up about 10 lb since 01/2012 at the time of dx of BRAO, and he's up about 20 lbs since 08/2011. Will check TSH to r/o hypothyroidism.
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
## 2012-08-18 ENCOUNTER — Other Ambulatory Visit: Payer: Self-pay | Admitting: *Deleted

## 2012-08-18 MED ORDER — TRAMADOL HCL 50 MG PO TABS: 50.0000 mg | ORAL_TABLET | Freq: Four times a day (QID) | ORAL | Status: DC | PRN

## 2012-08-18 MED ORDER — FLUTICASONE PROPIONATE 50 MCG/ACT NA SUSP: 2.0000 | Freq: Every day | NASAL | Status: DC

## 2012-08-18 NOTE — Telephone Encounter (Signed)
Refill request for FLONASE Last filled- 02/06/12, #16 X 6 Last seen- 06/18/12 Follow up - 3 MONTHS RX SENT.  Refill request for TRAMADOL Last filled- 03/2012 Last seen- 06/18/12 Follow up - 3 MONTHS Pt is using TRAMADOL for migraines.  Pt was told by neurology to stop using MAXALT. Please advise refills.

## 2012-09-17 ENCOUNTER — Encounter: Payer: Self-pay | Admitting: *Deleted

## 2012-09-17 ENCOUNTER — Ambulatory Visit (INDEPENDENT_AMBULATORY_CARE_PROVIDER_SITE_OTHER): Payer: BC Managed Care – PPO | Admitting: Family Medicine

## 2012-09-17 ENCOUNTER — Telehealth: Payer: Self-pay | Admitting: *Deleted

## 2012-09-17 ENCOUNTER — Encounter: Payer: Self-pay | Admitting: Family Medicine

## 2012-09-17 VITALS — BP 121/79 | HR 86 | Temp 98.2°F | Ht 74.0 in | Wt 239.0 lb

## 2012-09-17 DIAGNOSIS — F411 Generalized anxiety disorder: Secondary | ICD-10-CM

## 2012-09-17 DIAGNOSIS — R635 Abnormal weight gain: Secondary | ICD-10-CM

## 2012-09-17 DIAGNOSIS — G43009 Migraine without aura, not intractable, without status migrainosus: Secondary | ICD-10-CM

## 2012-09-17 DIAGNOSIS — H34239 Retinal artery branch occlusion, unspecified eye: Secondary | ICD-10-CM

## 2012-09-17 DIAGNOSIS — F419 Anxiety disorder, unspecified: Secondary | ICD-10-CM

## 2012-09-17 MED ORDER — PROPRANOLOL HCL ER 80 MG PO CP24: 80.0000 mg | ORAL_CAPSULE | Freq: Every day | ORAL | Status: DC

## 2012-09-17 NOTE — Assessment & Plan Note (Signed)
Stable. As per [facility] neurologist, the only thing left to recommend is a transesophageal echo to look for thrombus, so we will order this now. He'll continue ASA 325mg  and he will continue f/u with his local ophthalmologist.

## 2012-09-17 NOTE — Progress Notes (Signed)
OFFICE NOTE  09/17/2012  CC:  Chief Complaint  Patient presents with  . Follow-up     HPI: Patient is a 41 y.o. Caucasian male who is here for f/u migraine HA's and hx of branch retinal artery occlusion. HA's improved on amitriptyline but he reports that the med causes lethargy/lack of motivation, drowsiness, and possibly weight gain.  He wants to get off of it and try a different migraine prophylactic med. For abortion of migraine, he finds that alleve 2 tabs often helps and if it doesn't then he takes 1 ultram 50mg tab and this helps moderately well.   No new vision complaints.   He is worried about his weight gain and lack of motivation to exercise.  His energy level feels dissipated. He denies depression but admits that he lives his life feeling stressed and anxious most of the time, without panic attacks.  He does not want medications for anxiety.  Since I last saw him, he saw a neurologist at [facility] and he recommended that triptans (I had rx'd maxalt last visit) be avoided b/c of their vasoconstriction effect and the possible ramifications this could have on his BRAO condition.  He also presented his case at a conference and the only new suggestion was to get a transesophageal echo.  We discussed the pros/cons of getting this additional test done.  He had a normal transthoracic echo and normal event monitor in the recent past as part of his w/u. Pt also mentions that the neurologist did a genetic test on him and it came back negative--no further specifics known by pt, no records available at this time.  Pertinent PMH:  Past Medical History  Diagnosis Date  . Retinal artery occlusion 01/2012    Cardiac event monitor neg; carotid dopplers and transthoracic echo normal.  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 (hearing loss + BRAO)   Past surgical, social, and family history reviewed and no changes noted since last office visit.  MEDS:  Outpatient Prescriptions Prior to  Visit  Medication Sig Dispense Refill  . amitriptyline (ELAVIL) 25 MG tablet start with 1/2 tab at night for two weeks, and then increase to 1 tab at night from then on.  30 tablet  3  . aspirin 325 MG EC tablet Take 325 mg by mouth daily.      . clonazePAM (KLONOPIN) 1 MG tablet Take 1 tablet (1 mg total) by mouth 2 (two) times daily as needed for anxiety.  60 tablet  1  . fluticasone (FLONASE) 50 MCG/ACT nasal spray Place 2 sprays into the nose daily.  16 g  6  . traMADol (ULTRAM) 50 MG tablet Take 1 tablet (50 mg total) by mouth every 6 (six) hours as needed.  30 tablet  3   Last reviewed on 09/17/2012  8:05 AM by Stephanie C Phillips, CMA  PE: Blood pressure 121/79, pulse 86, temperature 98.2 F (36.8 C), temperature source Temporal, height 6' 2" (1.88 m), weight 239 lb (108.41 kg). Gen: Alert, well appearing.  Patient is oriented to person, place, time, and situation. AFFECT: pleasant, lucid thought and speech. CV: RRR, no m/r/g.   LUNGS: CTA bilat, nonlabored resps, good aeration in all lung fields.   IMPRESSION AND PLAN:  Migraine headache without aura Improved on amitriptyline but the med makes him too tired and lethargic and he feels like it is causing weight gain.   He wants to d/c this med so we will. We'll start trial of Inderal LA 80 mg   qhs.  Therapeutic expectations and side effect profile of medication discussed today.  Patient's questions answered. He'll continue alleve as first line HA med, then tramadol TWO tabs as back-up.  We have to avoid triptans as per neurologist rec's b/c of the potential vasocspasm/vasoconstriction effect of these meds--might exacerbate his BRAO condition.   Retinal artery occlusion, branch Stable. As per [facility] neurologist, the only thing left to recommend is a transesophageal echo to look for thrombus, so we will order this now. He'll continue ASA 325mg and he will continue f/u with his local ophthalmologist.  Weight gain,  abnormal Inactivity plus possible medication side effect (amitriptyline). We are getting him off amitriptyline so hopefully his energy level and motivation will return sufficiently to restart workouts. TSH in September this year was normal  Anxiety Chronic, plus additional situational anxiety related to his medical issues lately. He does not want medications for this, but we discussed basic meditation/prayer/unloading unnecessary responsibilities if he can, and exercise as the best non-medication ways to manage this.   An After Visit Summary was printed and given to the patient.  FOLLOW UP: 1 mo     

## 2012-09-17 NOTE — Assessment & Plan Note (Signed)
Chronic, plus additional situational anxiety related to his medical issues lately. He does not want medications for this, but we discussed basic meditation/prayer/unloading unnecessary responsibilities if he can, and exercise as the best non-medication ways to manage this.

## 2012-09-17 NOTE — Telephone Encounter (Signed)
I spoke with pt and he is aware TEE is scheduled for December 27,2013 at 1:00pm with Dr. Eden Emms. TEE letter of instruction mailed to pt and reviewed with pt over the phone. Mylo Red RN

## 2012-09-17 NOTE — Telephone Encounter (Signed)
[facility] scheduled pt for TEE on 09/24/12 at 12:00pm with Dr. Eden Emms. Pt pcp Dr. Milinda Cave requested TEE to r/o thrombus re: retinal artery occlusion  Mylo Red RN

## 2012-09-17 NOTE — Assessment & Plan Note (Signed)
Improved on amitriptyline but the med makes him too tired and lethargic and he feels like it is causing weight gain.   He wants to d/c this med so we will. We'll start trial of Inderal LA 80 mg qhs.  Therapeutic expectations and side effect profile of medication discussed today.  Patient's questions answered. He'll continue alleve as first line HA med, then tramadol TWO tabs as back-up.  We have to avoid triptans as per neurologist rec's b/c of the potential vasocspasm/vasoconstriction effect of these meds--might exacerbate his BRAO condition.

## 2012-09-17 NOTE — Assessment & Plan Note (Addendum)
Inactivity plus possible medication side effect (amitriptyline). We are getting him off amitriptyline so hopefully his energy level and motivation will return sufficiently to restart workouts. TSH in September this year was normal

## 2012-09-20 NOTE — Telephone Encounter (Signed)
Great!

## 2012-09-21 ENCOUNTER — Encounter (HOSPITAL_COMMUNITY): Payer: Self-pay | Admitting: Pharmacist

## 2012-09-24 ENCOUNTER — Encounter (HOSPITAL_COMMUNITY)
Admission: RE | Disposition: A | Discharge status: Home / Self Care | Payer: Self-pay | Source: Ambulatory Visit | Attending: Cardiovascular Disease

## 2012-09-24 ENCOUNTER — Encounter (HOSPITAL_COMMUNITY): Payer: Self-pay | Admitting: Gastroenterology

## 2012-09-24 ENCOUNTER — Telehealth: Payer: Self-pay | Admitting: *Deleted

## 2012-09-24 ENCOUNTER — Ambulatory Visit (HOSPITAL_COMMUNITY)
Admission: RE | Admit: 2012-09-24 | Discharge: 2012-09-24 | Disposition: A | Discharge status: Home / Self Care | Payer: BC Managed Care – PPO | Source: Ambulatory Visit | Attending: Cardiovascular Disease | Admitting: Cardiovascular Disease

## 2012-09-24 DIAGNOSIS — H34239 Retinal artery branch occlusion, unspecified eye: Secondary | ICD-10-CM

## 2012-09-24 DIAGNOSIS — I359 Nonrheumatic aortic valve disorder, unspecified: Secondary | ICD-10-CM | POA: Insufficient documentation

## 2012-09-24 DIAGNOSIS — I059 Rheumatic mitral valve disease, unspecified: Secondary | ICD-10-CM | POA: Insufficient documentation

## 2012-09-24 DIAGNOSIS — G459 Transient cerebral ischemic attack, unspecified: Secondary | ICD-10-CM

## 2012-09-24 HISTORY — PX: TEE WITHOUT CARDIOVERSION: SHX5443

## 2012-09-24 SURGERY — ECHOCARDIOGRAM, TRANSESOPHAGEAL
Anesthesia: Moderate Sedation

## 2012-09-24 MED ORDER — FENTANYL CITRATE 0.05 MG/ML IJ SOLN
INTRAMUSCULAR | Status: DC | PRN
  Administered 2012-09-24 (×3): 25 ug via INTRAVENOUS

## 2012-09-24 MED ORDER — SODIUM CHLORIDE 0.9 % IV SOLN
INTRAVENOUS | Status: DC
  Administered 2012-09-24: 500 mL via INTRAVENOUS

## 2012-09-24 MED ORDER — FENTANYL CITRATE 0.05 MG/ML IJ SOLN
INTRAMUSCULAR | Status: AC
  Filled 2012-09-24: qty 4

## 2012-09-24 MED ORDER — MIDAZOLAM HCL 5 MG/ML IJ SOLN
INTRAMUSCULAR | Status: AC
  Filled 2012-09-24: qty 3

## 2012-09-24 MED ORDER — SODIUM CHLORIDE 0.9 % IV SOLN: INTRAVENOUS | Status: DC

## 2012-09-24 MED ORDER — DIPHENHYDRAMINE HCL 50 MG/ML IJ SOLN
INTRAMUSCULAR | Status: AC
  Filled 2012-09-24: qty 1

## 2012-09-24 MED ORDER — BUTAMBEN-TETRACAINE-BENZOCAINE 2-2-14 % EX AERO
INHALATION_SPRAY | CUTANEOUS | Status: DC | PRN
  Administered 2012-09-24: 2 via TOPICAL

## 2012-09-24 MED ORDER — MIDAZOLAM HCL 10 MG/2ML IJ SOLN
INTRAMUSCULAR | Status: DC | PRN
  Administered 2012-09-24 (×3): 2 mg via INTRAVENOUS

## 2012-09-24 NOTE — Telephone Encounter (Signed)
Message copied by Elnora Morrison on Fri Sep 24, 2012  5:18 PM ------      Message from: Jeoffrey Massed      Created: Fri Sep 24, 2012  5:08 PM       Pls notify Henry [name] that his transesophageal echo was normal.  --thx

## 2012-09-24 NOTE — H&P (View-Only) (Signed)
OFFICE NOTE  09/17/2012  CC:  Chief Complaint  Patient presents with  . Follow-up     HPI: Patient is a 41 y.o. Caucasian male who is here for f/u migraine HA's and hx of branch retinal artery occlusion. HA's improved on amitriptyline but he reports that the med causes lethargy/lack of motivation, drowsiness, and possibly weight gain.  He wants to get off of it and try a different migraine prophylactic med. For abortion of migraine, he finds that alleve 2 tabs often helps and if it doesn't then he takes 1 ultram 50mg  tab and this helps moderately well.   No new vision complaints.   He is worried about his weight gain and lack of motivation to exercise.  His energy level feels dissipated. He denies depression but admits that he lives his life feeling stressed and anxious most of the time, without panic attacks.  He does not want medications for anxiety.  Since I last saw him, he saw a neurologist at [facility] and he recommended that triptans (I had rx'd maxalt last visit) be avoided b/c of their vasoconstriction effect and the possible ramifications this could have on his BRAO condition.  He also presented his case at a conference and the only new suggestion was to get a transesophageal echo.  We discussed the pros/cons of getting this additional test done.  He had a normal transthoracic echo and normal event monitor in the recent past as part of his w/u. Pt also mentions that the neurologist did a genetic test on him and it came back negative--no further specifics known by pt, no records available at this time.  Pertinent PMH:  Past Medical History  Diagnosis Date  . Retinal artery occlusion 01/2012    Cardiac event monitor neg; carotid dopplers and transthoracic echo normal.  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 (hearing loss + BRAO)   Past surgical, social, and family history reviewed and no changes noted since last office visit.  MEDS:  Outpatient Prescriptions Prior to  Visit  Medication Sig Dispense Refill  . amitriptyline (ELAVIL) 25 MG tablet start with 1/2 tab at night for two weeks, and then increase to 1 tab at night from then on.  30 tablet  3  . aspirin 325 MG EC tablet Take 325 mg by mouth daily.      . clonazePAM (KLONOPIN) 1 MG tablet Take 1 tablet (1 mg total) by mouth 2 (two) times daily as needed for anxiety.  60 tablet  1  . fluticasone (FLONASE) 50 MCG/ACT nasal spray Place 2 sprays into the nose daily.  16 g  6  . traMADol (ULTRAM) 50 MG tablet Take 1 tablet (50 mg total) by mouth every 6 (six) hours as needed.  30 tablet  3   Last reviewed on 09/17/2012  8:05 AM by Luisa Dago, CMA  PE: Blood pressure 121/79, pulse 86, temperature 98.2 F (36.8 C), temperature source Temporal, height 6\' 2"  (1.88 m), weight 239 lb (108.41 kg). Gen: Alert, well appearing.  Patient is oriented to person, place, time, and situation. AFFECT: pleasant, lucid thought and speech. CV: RRR, no m/r/g.   LUNGS: CTA bilat, nonlabored resps, good aeration in all lung fields.   IMPRESSION AND PLAN:  Migraine headache without aura Improved on amitriptyline but the med makes him too tired and lethargic and he feels like it is causing weight gain.   He wants to d/c this med so we will. We'll start trial of Inderal LA 80 mg  qhs.  Therapeutic expectations and side effect profile of medication discussed today.  Patient's questions answered. He'll continue alleve as first line HA med, then tramadol TWO tabs as back-up.  We have to avoid triptans as per neurologist rec's b/c of the potential vasocspasm/vasoconstriction effect of these meds--might exacerbate his BRAO condition.   Retinal artery occlusion, branch Stable. As per [facility] neurologist, the only thing left to recommend is a transesophageal echo to look for thrombus, so we will order this now. He'll continue ASA 325mg  and he will continue f/u with his local ophthalmologist.  Weight gain,  abnormal Inactivity plus possible medication side effect (amitriptyline). We are getting him off amitriptyline so hopefully his energy level and motivation will return sufficiently to restart workouts. TSH in September this year was normal  Anxiety Chronic, plus additional situational anxiety related to his medical issues lately. He does not want medications for this, but we discussed basic meditation/prayer/unloading unnecessary responsibilities if he can, and exercise as the best non-medication ways to manage this.   An After Visit Summary was printed and given to the patient.  FOLLOW UP: 1 mo

## 2012-09-24 NOTE — Progress Notes (Signed)
Echocardiogram Echocardiogram Transesophageal has been performed.  Henry [name] 09/24/2012, 3:44 PM

## 2012-09-24 NOTE — Interval H&P Note (Signed)
History and Physical Interval Note:  09/24/2012 1:00 PM  Henry [name]  has presented today for surgery, with the diagnosis of rule out thrombis/retinal artery occlusion  The various methods of treatment have been discussed with the patient and family. After consideration of risks, benefits and other options for treatment, the patient has consented to  Procedure(s) (LRB) with comments: TRANSESOPHAGEAL ECHOCARDIOGRAM (TEE) (N/A) as a surgical intervention .  The patient's history has been reviewed, patient examined, no change in status, stable for surgery.  I have reviewed the patient's chart and labs.  Questions were answered to the patient's satisfaction.     Charlton Haws

## 2012-09-24 NOTE — CV Procedure (Signed)
TEE: 7mg  versed 75ug fentanyl and 25 mg benedryl  Normal EF 60% Trivial MR Trivial AR No ASD or PFO negative bubble study Normal LA/RA/RV Normal aorta No LAA thrombus  No cardiac source of embolus  Henry [name] 1:43 PM 09/24/2012

## 2012-09-24 NOTE — Telephone Encounter (Signed)
Patient was sleeping . Patient wife given message of normal results of transesophageal echo was normal. Wife stated the doctor had gone over with them the results after the test was done. Reassuring to know this.

## 2012-09-27 ENCOUNTER — Encounter (HOSPITAL_COMMUNITY): Payer: Self-pay | Admitting: Cardiovascular Disease

## 2012-10-15 ENCOUNTER — Ambulatory Visit (INDEPENDENT_AMBULATORY_CARE_PROVIDER_SITE_OTHER): Payer: BC Managed Care – PPO | Admitting: Family Medicine

## 2012-10-15 ENCOUNTER — Encounter: Payer: Self-pay | Admitting: Family Medicine

## 2012-10-15 VITALS — BP 110/70 | HR 78 | Ht 74.0 in | Wt 237.0 lb

## 2012-10-15 DIAGNOSIS — G4700 Insomnia, unspecified: Secondary | ICD-10-CM

## 2012-10-15 DIAGNOSIS — H34239 Retinal artery branch occlusion, unspecified eye: Secondary | ICD-10-CM

## 2012-10-15 DIAGNOSIS — G43009 Migraine without aura, not intractable, without status migrainosus: Secondary | ICD-10-CM

## 2012-10-15 MED ORDER — TEMAZEPAM 7.5 MG PO CAPS: ORAL_CAPSULE | ORAL | Status: DC

## 2012-10-15 NOTE — Assessment & Plan Note (Signed)
Will start trial of generic restoril 7.5, 1-2 qhs prn, #30 with 1 RF. He will likely only be taking this a few times a month.

## 2012-10-15 NOTE — Assessment & Plan Note (Signed)
The final piece of the thromboembolic w/u for this has been completed and is normal (TEE). Will fax copy of his TEE to his neurologist at [facility], Dr. Ron Parker. Continue ASA 325mg  qd.

## 2012-10-15 NOTE — Assessment & Plan Note (Signed)
Improved with exercise, no prophylactic med.   Continue with this and prn alleve or tramadol.

## 2012-10-15 NOTE — Progress Notes (Signed)
OFFICE NOTE  10/15/2012  CC:  Chief Complaint  Patient presents with  . Follow-up    migraines, has stopped all meds except ASA     HPI: Patient is a 42 y.o. Caucasian male who is here for 1 mo f/u migraine HA's.  Last month we d/c'd amitriptyline and started trial of inderal LA for migraine prophylaxis.  He decided not to try the inderal and just started exercising.  This, and getting off of amitriptyline has helped significantly.  He has had to use alleve some and only a rare tramadol. Still c/o insomnia, bothersome at least several nights a month.  OTC sleep aids, including melatonin have been tried--no help.  Has never been on rx sleep aid.  Pertinent PMH:  Past Medical History  Diagnosis Date  . Retinal artery occlusion 01/2012    Cardiac event monitor neg; carotid dopplers and transthoracic echo normal.  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 (hearing loss + BRAO)    MEDS:  Outpatient Prescriptions Prior to Visit  Medication Sig Dispense Refill  . aspirin 325 MG EC tablet Take 325 mg by mouth daily.      Marland Kitchen b complex vitamins capsule Take 1 capsule by mouth daily.      . Multiple Vitamin (MULTIVITAMIN WITH MINERALS) TABS Take 1 tablet by mouth daily.      . Naproxen Sodium (ALEVE) 220 MG CAPS Take 220-440 mg by mouth every 12 (twelve) hours as needed. For migraines      . Polyethyl Glycol-Propyl Glycol (SYSTANE ULTRA) 0.4-0.3 % SOLN Place 1-2 drops into both eyes 2 (two) times daily as needed. For dry eyes      . traMADol (ULTRAM) 50 MG tablet Take 50 mg by mouth every 6 (six) hours as needed. For migraines      . fluticasone (FLONASE) 50 MCG/ACT nasal spray Place 2 sprays into the nose daily.  16 g  6  . propranolol ER (INDERAL LA) 80 MG 24 hr capsule Take 1 capsule (80 mg total) by mouth daily.  30 capsule  1  . [DISCONTINUED] Chlorphen-Phenyleph-ASA (ALKA-SELTZER PLUS COLD PO) Take 1 tablet by mouth at bedtime as needed. For cold symptoms      .  [DISCONTINUED] fluticasone (FLONASE) 50 MCG/ACT nasal spray Place 1 spray into the nose daily.       Last reviewed on 10/15/2012  8:36 AM by Jeoffrey Massed, MD  PE: Blood pressure 110/70, pulse 78, height 6\' 2"  (1.88 m), weight 237 lb (107.502 kg). Gen: Alert, well appearing.  Patient is oriented to person, place, time, and situation. AFFECT: pleasant, lucid thought and speech. No further exam today.  IMPRESSION AND PLAN:  Migraine headache without aura Improved with exercise, no prophylactic med.   Continue with this and prn alleve or tramadol.  Retinal artery occlusion, branch The final piece of the thromboembolic w/u for this has been completed and is normal (TEE). Will fax copy of his TEE to his neurologist at [facility], Dr. Ron Parker. Continue ASA 325mg  qd.    Insomnia Will start trial of generic restoril 7.5, 1-2 qhs prn, #30 with 1 RF. He will likely only be taking this a few times a month.   An After Visit Summary was printed and given to the patient.  FOLLOW UP: prn

## 2012-11-08 ENCOUNTER — Ambulatory Visit (INDEPENDENT_AMBULATORY_CARE_PROVIDER_SITE_OTHER): Payer: BC Managed Care – PPO | Admitting: Family Medicine

## 2012-11-08 ENCOUNTER — Encounter: Payer: Self-pay | Admitting: Family Medicine

## 2012-11-08 VITALS — BP 118/77 | HR 85 | Temp 97.9°F | Ht 74.0 in | Wt 236.0 lb

## 2012-11-08 DIAGNOSIS — J020 Streptococcal pharyngitis: Secondary | ICD-10-CM | POA: Insufficient documentation

## 2012-11-08 MED ORDER — AMOXICILLIN 875 MG PO TABS: 875.0000 mg | ORAL_TABLET | Freq: Two times a day (BID) | ORAL | Status: DC

## 2012-11-08 NOTE — Progress Notes (Signed)
OFFICE NOTE  11/08/2012  CC:  Chief Complaint  Patient presents with  . Sore Throat     HPI: Patient is a 42 y.o. Caucasian male who is here for resp illness. Onset 2 days ago, primary symptoms are: ST, achy, fever.  +HA.    Daughter dx'd with strep + pharyngitis today.   Pertinent negatives: no cough, no nasal congestion or runny nose. Flu shot this season at least 2 wks ago? yes  Additional ROS: no n/v/d or abdominal pain.  No rash.  No neck stiffness.   +Mild fatigue.  +Mild appetite loss.   Pertinent PMH:  Past Medical History  Diagnosis Date  . Retinal artery occlusion 01/2012    Cardiac event monitor neg; carotid dopplers and transthoracic echo normal.  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 (hearing loss + BRAO)    MEDS: ASA 325mg  qd   PE: Blood pressure 118/77, pulse 85, temperature 97.9 F (36.6 C), temperature source Temporal, height 6\' 2"  (1.88 m), weight 236 lb (107.049 kg), SpO2 98.00%. Gen: Alert, tired appearing.  Patient is oriented to person, place, time, and situation. ENT: Ears: EACs clear, normal epithelium.  TMs with good light reflex and landmarks bilaterally.  Eyes: no injection, icteris, swelling, or exudate.  EOMI, PERRLA. Nose: no drainage or turbinate edema/swelling.  No injection or focal lesion.  Mouth: lips without lesion/swelling.  Oral mucosa pink and moist.  Dentition intact and without obvious caries or gingival swelling.  Oropharynx with diffuse erythema, swelling of tonsils and soft palate and posterior pharynx--symmetric tonsils.  Minimal exudate noted. NECK: diffuse anterior cervical lymphadenopathy, +tender. CV: RRR, no m/r/g.   LUNGS: CTA bilat, nonlabored resps, good aeration in all lung fields. Skin - no sores or suspicious lesions or rashes or color changes  Rapid strep: POSITIVE  IMPRESSION AND PLAN:  Pharyngitis, streptococcal, acute Amoxil 875 mg bid x 10d. OTC antipyretic/pain med prn.  Rest,  fluids.   An After Visit Summary was printed and given to the patient.  FOLLOW UP: prn

## 2012-11-08 NOTE — Assessment & Plan Note (Signed)
Amoxil 875 mg bid x 10d. OTC antipyretic/pain med prn.  Rest, fluids.

## 2013-01-26 ENCOUNTER — Ambulatory Visit (INDEPENDENT_AMBULATORY_CARE_PROVIDER_SITE_OTHER): Payer: BC Managed Care – PPO | Admitting: Family Medicine

## 2013-01-26 ENCOUNTER — Encounter: Payer: Self-pay | Admitting: Family Medicine

## 2013-01-26 VITALS — BP 121/74 | HR 72 | Temp 97.9°F | Resp 16 | Wt 240.0 lb

## 2013-01-26 DIAGNOSIS — N486 Induration penis plastica: Secondary | ICD-10-CM

## 2013-01-26 DIAGNOSIS — R300 Dysuria: Secondary | ICD-10-CM

## 2013-01-26 DIAGNOSIS — R5383 Other fatigue: Secondary | ICD-10-CM | POA: Insufficient documentation

## 2013-01-26 DIAGNOSIS — R17 Unspecified jaundice: Secondary | ICD-10-CM

## 2013-01-26 DIAGNOSIS — R5381 Other malaise: Secondary | ICD-10-CM

## 2013-01-26 LAB — CBC WITH DIFFERENTIAL/PLATELET
Basophils Relative: 0.3 % (ref 0.0–3.0)
Eosinophils Absolute: 0.1 10*3/uL (ref 0.0–0.7)
Eosinophils Relative: 1.5 % (ref 0.0–5.0)
HCT: 45.7 % (ref 39.0–52.0)
Lymphs Abs: 1.8 10*3/uL (ref 0.7–4.0)
MCHC: 34.2 g/dL (ref 30.0–36.0)
MCV: 84.5 fl (ref 78.0–100.0)
Monocytes Absolute: 0.4 10*3/uL (ref 0.1–1.0)
Platelets: 223 10*3/uL (ref 150.0–400.0)
RBC: 5.41 Mil/uL (ref 4.22–5.81)
WBC: 6.2 10*3/uL (ref 4.5–10.5)

## 2013-01-26 LAB — COMPREHENSIVE METABOLIC PANEL
CO2: 28 mEq/L (ref 19–32)
Creatinine, Ser: 0.9 mg/dL (ref 0.4–1.5)
GFR: 99.93 mL/min (ref >60.00)
Glucose, Bld: 77 mg/dL (ref 70–99)
Total Bilirubin: 1.7 mg/dL — ABNORMAL HIGH (ref 0.3–1.2)

## 2013-01-26 LAB — POCT URINALYSIS DIPSTICK
Blood, UA: NEGATIVE
Ketones, UA: NEGATIVE
Protein, UA: NEGATIVE
Spec Grav, UA: 1.025

## 2013-01-26 LAB — SEDIMENTATION RATE: Sed Rate: 6 mm/hr (ref 0–22)

## 2013-01-26 MED ORDER — TRAMADOL HCL 50 MG PO TABS: 50.0000 mg | ORAL_TABLET | Freq: Four times a day (QID) | ORAL | Status: DC | PRN

## 2013-01-26 NOTE — Assessment & Plan Note (Signed)
Urology referral for confirmation of dx and discussion of any possible management options at this time (?pentoxyfilline?). Reassured pt that he doesn't have a UTI.

## 2013-01-26 NOTE — Progress Notes (Signed)
OFFICE NOTE  01/26/2013  CC:  Chief Complaint  Patient presents with  . Urinary Tract Infection    Pt c/o burning w/urination x3 days & pain w/erection x3 wks      HPI: Patient is a 42 y.o. Caucasian male who is here for burning with urination. First, 3 wks ago he noted painful erections.   Then 3-4 days ago he noted mild burning sensation in penis--both when he peed and when he was not peeing. Yesterday he noted a lump in shaft of penis.  Intensity of pain 3/10.   He is able to complete intercourse and his ejaculate appears normal.  Reports that his sexual libido is still intact.  ROS: tired/fatigued x couple months, still says he can't exercise like he used to so he has essentially quit doing this.  More sick lately with viral illnesses.  Pertinent PMH:  Past Medical History  Diagnosis Date  . Retinal artery occlusion 01/2012    Cardiac event monitor neg; carotid dopplers and transthoracic echo normal.  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 (hearing loss + BRAO)    MEDS:  Outpatient Prescriptions Prior to Visit  Medication Sig Dispense Refill  . aspirin 325 MG EC tablet Take 325 mg by mouth daily.      Marland Kitchen b complex vitamins capsule Take 1 capsule by mouth daily.      . fluticasone (FLONASE) 50 MCG/ACT nasal spray Place 2 sprays into the nose daily.  16 g  6  . Multiple Vitamin (MULTIVITAMIN WITH MINERALS) TABS Take 1 tablet by mouth daily.      . Naproxen Sodium (ALEVE) 220 MG CAPS Take 220-440 mg by mouth every 12 (twelve) hours as needed. For migraines      . Polyethyl Glycol-Propyl Glycol (SYSTANE ULTRA) 0.4-0.3 % SOLN Place 1-2 drops into both eyes 2 (two) times daily as needed. For dry eyes      . temazepam (RESTORIL) 7.5 MG capsule 1-2 tabs po qhs prn insomnia  30 capsule  1  . traMADol (ULTRAM) 50 MG tablet Take 50 mg by mouth every 6 (six) hours as needed. For migraines      . amoxicillin (AMOXIL) 875 MG tablet Take 1 tablet (875 mg total) by mouth 2  (two) times daily.  20 tablet  0   No facility-administered medications prior to visit.    PE: Blood pressure 121/74, pulse 72, temperature 97.9 F (36.6 C), temperature source Oral, resp. rate 16, weight 240 lb (108.863 kg), SpO2 98.00%. Gen: Alert, well appearing.  Patient is oriented to person, place, time, and situation. ENT: Ears: EACs clear, normal epithelium.  TMs with good light reflex and landmarks bilaterally.  Eyes: no injection, icteris, swelling, or exudate.  EOMI, PERRLA. Nose: no drainage or turbinate edema/swelling.  No injection or focal lesion.  Mouth: lips without lesion/swelling.  Oral mucosa pink and moist.  Dentition intact and without obvious caries or gingival swelling.  Oropharynx without erythema, exudate, or swelling.  Neck - No masses or thyromegaly or limitation in range of motion CV: RRR, no m/r/g.   LUNGS: CTA bilat, nonlabored resps, good aeration in all lung fields. EXT: no clubbing, cyanosis, or edema.  GU: Circumcised penis without curvature.  Glans and meatus normal.  Testes normal, no groin LAD. On dorsal aspect of shaft there is a subtle nodule palpable deep in penile tissue, mildly tender to palpate in that area.    LAB: CC UA today was normal.  IMPRESSION AND PLAN:  Peyronie disease  Urology referral for confirmation of dx and discussion of any possible management options at this time (?pentoxyfilline?). Reassured pt that he doesn't have a UTI.  Other malaise and fatigue With some exercise intolerance. I will recheck a few basic labs (CBC, CMET, TSH, ESR).  Exam is reassuring today.  No signs of depression.  He is definitely living a very stressful/busy life. I asked him to ask his wife to monitor for apneic spells while he sleeps and he said he would.   An After Visit Summary was printed and given to the patient.  FOLLOW UP: prn

## 2013-01-26 NOTE — Assessment & Plan Note (Addendum)
With some exercise intolerance. I will recheck a few basic labs (CBC, CMET, TSH, ESR).  Exam is reassuring today.  No signs of depression.  He is definitely living a very stressful/busy life. I asked him to ask his wife to monitor for apneic spells while he sleeps and he said he would.

## 2013-01-27 ENCOUNTER — Encounter: Payer: Self-pay | Admitting: Family Medicine

## 2013-01-27 ENCOUNTER — Ambulatory Visit: Payer: BC Managed Care – PPO

## 2013-01-27 DIAGNOSIS — N486 Induration penis plastica: Secondary | ICD-10-CM

## 2013-01-27 DIAGNOSIS — R17 Unspecified jaundice: Secondary | ICD-10-CM

## 2013-01-27 HISTORY — DX: Induration penis plastica: N48.6

## 2013-01-27 LAB — BILIRUBIN, DIRECT: Bilirubin, Direct: 0.2 mg/dL (ref 0.0–0.3)

## 2013-01-27 LAB — HEPATITIS B SURFACE ANTIGEN: Hepatitis B Surface Ag: NEGATIVE

## 2013-01-27 LAB — HEPATITIS C ANTIBODY: HCV Ab: NEGATIVE

## 2013-01-27 NOTE — Addendum Note (Signed)
Addended by: Baldemar Lenis R on: 01/27/2013 03:59 PM   Modules accepted: Orders

## 2013-01-28 ENCOUNTER — Encounter: Payer: Self-pay | Admitting: *Deleted

## 2013-02-16 ENCOUNTER — Encounter: Payer: Self-pay | Admitting: Family Medicine

## 2013-03-17 ENCOUNTER — Encounter: Payer: Self-pay | Admitting: Family Medicine

## 2013-03-17 ENCOUNTER — Ambulatory Visit (INDEPENDENT_AMBULATORY_CARE_PROVIDER_SITE_OTHER): Payer: BC Managed Care – PPO | Admitting: Family Medicine

## 2013-03-17 VITALS — BP 119/80 | HR 103 | Temp 99.2°F | Resp 16 | Ht 74.0 in | Wt 239.0 lb

## 2013-03-17 DIAGNOSIS — J020 Streptococcal pharyngitis: Secondary | ICD-10-CM | POA: Insufficient documentation

## 2013-03-17 DIAGNOSIS — M25511 Pain in right shoulder: Secondary | ICD-10-CM

## 2013-03-17 DIAGNOSIS — J029 Acute pharyngitis, unspecified: Secondary | ICD-10-CM

## 2013-03-17 DIAGNOSIS — M25519 Pain in unspecified shoulder: Secondary | ICD-10-CM

## 2013-03-17 MED ORDER — AMOXICILLIN 875 MG PO TABS: 875.0000 mg | ORAL_TABLET | Freq: Two times a day (BID) | ORAL | Status: DC

## 2013-03-17 NOTE — Assessment & Plan Note (Signed)
Amoxil 875mg  bid x 10d.

## 2013-03-17 NOTE — Patient Instructions (Addendum)
OTC lice med to try: RID  Make appt at your convenience for right shoulder injection

## 2013-03-17 NOTE — Assessment & Plan Note (Signed)
Suspect rotator cuff tendonitis with impingement syndrome. Recommended NSAIDs, return for shoulder injection when he is completely over his current illness.

## 2013-03-17 NOTE — Progress Notes (Signed)
OFFICE NOTE  03/17/2013  CC:  Chief Complaint  Patient presents with  . Sore Throat  . Generalized Body Aches     HPI: Patient is a 42 y.o. Caucasian male who is here for sore throat. 24 hr hx ST, some mild nasal sx's and cough (PND ?), mild malaise and achiness, subjective fevers.  No rash. SOme HA.  N/v/d.  Also, has additional c/o of about 6 wk hx of right shoulder pain.  He lifts wts but denies acute injury preceding the onset of the pain.  Says it hurts to raise arm above shoulder, also hurts some when at rest--over anterolateral shoulder and down upper arm but not past elbow level.  He has occ feeling of tingling in forearm and hand but nothing persistent.  Sx's unrelated to any head/neck position.  It bothers him when he tries to rest at night.   Pertinent PMH:  Past Medical History  Diagnosis Date  . Retinal artery occlusion 01/2012    Cardiac event monitor neg; carotid dopplers and transthoracic echo normal.  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 (hearing loss + BRAO)  . Peyronie's disease 01/2013    Urology: Vit E 400 U/day    MEDS:  Outpatient Prescriptions Prior to Visit  Medication Sig Dispense Refill  . aspirin 325 MG EC tablet Take 325 mg by mouth daily.      Marland Kitchen b complex vitamins capsule Take 1 capsule by mouth daily.      . fluticasone (FLONASE) 50 MCG/ACT nasal spray Place 2 sprays into the nose daily.  16 g  6  . Multiple Vitamin (MULTIVITAMIN WITH MINERALS) TABS Take 1 tablet by mouth daily.      . Naproxen Sodium (ALEVE) 220 MG CAPS Take 220-440 mg by mouth every 12 (twelve) hours as needed. For migraines      . Polyethyl Glycol-Propyl Glycol (SYSTANE ULTRA) 0.4-0.3 % SOLN Place 1-2 drops into both eyes 2 (two) times daily as needed. For dry eyes      . temazepam (RESTORIL) 7.5 MG capsule 1-2 tabs po qhs prn insomnia  30 capsule  1  . traMADol (ULTRAM) 50 MG tablet Take 1 tablet (50 mg total) by mouth every 6 (six) hours as needed. For  migraines  30 tablet  2   No facility-administered medications prior to visit.    PE: Blood pressure 119/80, pulse 103, temperature 99.2 F (37.3 C), temperature source Oral, resp. rate 16, height 6\' 2"  (1.88 m), weight 239 lb (108.41 kg), SpO2 98.00%. Gen: alert, tired appearing.  Moves slowly due to soreness. ENT: Ears: EACs clear, normal epithelium.  TMs with good light reflex and landmarks bilaterally.  Eyes: no injection, icteris, swelling, or exudate.  EOMI, PERRLA. Nose: no drainage or turbinate edema/swelling.  No injection or focal lesion.  Mouth: lips without lesion/swelling.  Oral mucosa pink and moist.  Dentition intact and without obvious caries or gingival swelling.  Oropharynx with diffuse erythema, mild swelling uvula.  No exudate.  No asymmetry.   NECK: mildly tender anteriorly, with some palpable LAD anteriorly. CV: regular, mildly tachycardic, no m/r/g LUNGS: CTA bilat, nonlabored EXT: no clubbing, cyanosis, or edema.  Skin - no sores or suspicious lesions or rashes or color changes Right shoulder: ROM intact but painful in ER and abduction, + impingement signs.  No tenderness of shoulder girdle region.  UE strength 5/5 bilat.  DTRs trace in triceps and biceps bilat. Neck: full ROM, nontender.   IMPRESSION AND PLAN:  Pain, joint,  shoulder region, right Suspect rotator cuff tendonitis with impingement syndrome. Recommended NSAIDs, return for shoulder injection when he is completely over his current illness.  Pharyngitis, streptococcal, acute Amoxil 875mg  bid x 10d.   An After Visit Summary was printed and given to the patient.  FOLLOW UP: prn

## 2013-06-03 ENCOUNTER — Telehealth: Payer: Self-pay | Admitting: Family Medicine

## 2013-06-03 MED ORDER — TEMAZEPAM 7.5 MG PO CAPS: ORAL_CAPSULE | ORAL | Status: DC

## 2013-06-03 NOTE — Telephone Encounter (Signed)
Patients pharmacy requesting temazepam 7.5mg  1-2 caps QHS.  Patient last seen 6/19. No upcoming appointments.  Medication last printed 10/15/12 x 1 refill.  Please advise.

## 2013-06-03 NOTE — Telephone Encounter (Signed)
Rx printed

## 2013-08-11 ENCOUNTER — Telehealth: Payer: Self-pay | Admitting: Family Medicine

## 2013-08-11 NOTE — Telephone Encounter (Signed)
Patent requesting tramadol refill.  Patient last seen 03/17/13.  Last rx was 01/26/13 x 2 refills.  Please advise refill.

## 2013-08-12 MED ORDER — TRAMADOL HCL 50 MG PO TABS: ORAL_TABLET | ORAL | Status: DC

## 2013-08-12 NOTE — Telephone Encounter (Signed)
Tramadol - rx done 

## 2013-08-12 NOTE — Telephone Encounter (Signed)
rx faxed to pharmacy

## 2014-05-19 ENCOUNTER — Ambulatory Visit (INDEPENDENT_AMBULATORY_CARE_PROVIDER_SITE_OTHER): Payer: BC Managed Care – PPO

## 2014-05-19 VITALS — BP 145/92 | HR 79 | Resp 15 | Ht 74.0 in | Wt 245.0 lb

## 2014-05-19 DIAGNOSIS — M79671 Pain in right foot: Secondary | ICD-10-CM

## 2014-05-19 DIAGNOSIS — M722 Plantar fascial fibromatosis: Secondary | ICD-10-CM

## 2014-05-19 DIAGNOSIS — M79609 Pain in unspecified limb: Secondary | ICD-10-CM

## 2014-05-19 DIAGNOSIS — M79672 Pain in left foot: Secondary | ICD-10-CM

## 2014-05-19 MED ORDER — MELOXICAM 15 MG PO TABS: 15.0000 mg | ORAL_TABLET | Freq: Every day | ORAL | Status: DC

## 2014-05-19 NOTE — Patient Instructions (Addendum)
Plantar Fasciitis Plantar fasciitis is a common condition that causes foot pain. It is soreness (inflammation) of the band of tough fibrous tissue on the bottom of the foot that runs from the heel bone (calcaneus) to the ball of the foot. The cause of this soreness may be from excessive standing, poor fitting shoes, running on hard surfaces, being overweight, having an abnormal walk, or overuse (this is common in runners) of the painful foot or feet. It is also common in aerobic exercise dancers and ballet dancers. SYMPTOMS  Most people with plantar fasciitis complain of:  Severe pain in the morning on the bottom of their foot especially when taking the first steps out of bed. This pain recedes after a few minutes of walking.  Severe pain is experienced also during walking following a long period of inactivity.  Pain is worse when walking barefoot or up stairs DIAGNOSIS   Your caregiver will diagnose this condition by examining and feeling your foot.  Special tests such as X-rays of your foot, are usually not needed. PREVENTION   Consult a sports medicine professional before beginning a new exercise program.  Walking programs offer a good workout. With walking there is a lower chance of overuse injuries common to runners. There is less impact and less jarring of the joints.  Begin all new exercise programs slowly. If problems or pain develop, decrease the amount of time or distance until you are at a comfortable level.  Wear good shoes and replace them regularly.  Stretch your foot and the heel cords at the back of the ankle (Achilles tendon) both before and after exercise.  Run or exercise on even surfaces that are not hard. For example, asphalt is better than pavement.  Do not run barefoot on hard surfaces.  If using a treadmill, vary the incline.  Do not continue to workout if you have foot or joint problems. Seek professional help if they do not improve. HOME CARE INSTRUCTIONS    Avoid activities that cause you pain until you recover.  Use ice or cold packs on the problem or painful areas after working out.  Only take over-the-counter or prescription medicines for pain, discomfort, or fever as directed by your caregiver.  Soft shoe inserts or athletic shoes with air or gel sole cushions may be helpful.  If problems continue or become more severe, consult a sports medicine caregiver or your own health care provider. Cortisone is a potent anti-inflammatory medication that may be injected into the painful area. You can discuss this treatment with your caregiver. MAKE SURE YOU:   Understand these instructions.  Will watch your condition.  Will get help right away if you are not doing well or get worse. Document Released: 06/10/2001 Document Revised: 12/08/2011 Document Reviewed: 08/09/2008 [facility] Patient Information 2015 [place], [place]. This information is not intended to replace advice given to you by your health care provider. Make sure you discuss any questions you have with your health care provider.     CE INSTRUCTIONS  Apply ice or cold pack to the affected area at least 3 times a day for 10-15 minutes each time.  You should also use ice after prolonged activity or vigorous exercise.  Do not apply ice longer than 20 minutes at one time.  Always keep a cloth between your skin and the ice pack to prevent burns.  Being consistent and following these instructions will help control your symptoms.  We suggest you purchase a gel ice pack because they are  reusable and do bit leak.  Some of them are designed to wrap around the area.  Use the method that works best for you.  Here are some other suggestions for icing.   Use a frozen bag of peas or corn-inexpensive and molds well to your body, usually stays frozen for 10 to 20 minutes.  Wet a towel with cold water and squeeze out the excess until it's damp.  Place in a bag in the freezer for 20 minutes. Then remove  and use.

## 2014-05-19 NOTE — Progress Notes (Signed)
   Subjective:    Patient ID: Henry [name], male    DOB: 27-May-1971, 43 y.o.   MRN: [id number]  HPI Comments: N heel pain L left > right heel and inferior arch D 3 - 4 months O after hiatus from exercise running, began running again C dull constantly then abruptly can become sharp pain in activity A during activity and after resting T stretching and icing     Review of Systems  All other systems reviewed and are negative.      Objective:   Physical Exam Lower extremity objective findings reveal neurovascular status is intact 43 year old white male well-developed well-nourished oriented x3 has left heel pain right has minimal tenderness most likely compensatory gait changes of the left hand first up in the morning or getting up 5. Rest. He wears new balance shoes otherwise is doing stable shoes however did stop running and resumed running recently after having gained some weight this may lead to the fascia strain or injury.  Lower extremity objective findings reveal pedal pulses palpable DP and PT +2/4 capillary refill time 3 seconds epicritic and proprioceptive sensations intact and symmetric bilateral normal plantar response DTRs not elicited dermatologically skin color pigment normal hair growth present but diminished distally nails unremarkable. X-rays reveal no sign of fracture or osseous abnormality mild fascial thickening no inferior calcaneal spurring noted       Assessment & Plan:  Assessment plantar fasciitis/heel spur syndrome left significantly worse than right at the inferior calcaneal insertion medial calcaneal tubercle area. Plan at this time fascial strapping applied recommended ice to the affected area prescription for [facility] is given maintain stable shoes including things like crocs for around the house no barefoot in flip-flops or flimsy shoes. Reappointed in 2 weeks for followup reevaluation may be candidate for orthoses for further followup based on progress if no  improvement consider steroid injection  Harriet Masson DPM

## 2014-06-07 ENCOUNTER — Ambulatory Visit (INDEPENDENT_AMBULATORY_CARE_PROVIDER_SITE_OTHER): Payer: BC Managed Care – PPO

## 2014-06-07 VITALS — BP 141/77 | HR 72 | Resp 12

## 2014-06-07 DIAGNOSIS — M79671 Pain in right foot: Secondary | ICD-10-CM

## 2014-06-07 DIAGNOSIS — M25511 Pain in right shoulder: Secondary | ICD-10-CM

## 2014-06-07 DIAGNOSIS — M79672 Pain in left foot: Secondary | ICD-10-CM

## 2014-06-07 DIAGNOSIS — M79609 Pain in unspecified limb: Secondary | ICD-10-CM

## 2014-06-07 DIAGNOSIS — M25519 Pain in unspecified shoulder: Secondary | ICD-10-CM

## 2014-06-07 DIAGNOSIS — M722 Plantar fascial fibromatosis: Secondary | ICD-10-CM

## 2014-06-07 NOTE — Patient Instructions (Signed)

## 2014-06-07 NOTE — Progress Notes (Signed)
   Subjective:    Patient ID: Henry [name], male    DOB: March 11, 1971, 43 y.o.   MRN: [id number]  HPI  ''LT FOOT ARCH AND HEEL STILL PAINFUL, BUT A LITTLE BETTER.''  Review of Systems     Objective:   Physical Exam        Assessment & Plan:

## 2014-06-07 NOTE — Progress Notes (Signed)
   Subjective:    Patient ID: Henry [name], male    DOB: 1971/02/08, 43 y.o.   MRN: [id number]  HPI patient presents this time for followup of plantar fasciitis/heel spur syndrome. Patient had improvement with taping was in place left foot more significant than right.    Review of Systems no new findings or systemic changes no     Objective:   Physical Exam Vascular status is intact pedal pulses are palpable epicritic and proprioceptive sensations intact and symmetric bilateral patient's plantar left heel still tender on palpation although not as severe specimen taping was in place. No other new changes or findings noted.      Assessment & Plan:  Assessment plantar fasciitis/heel spur syndrome respond to biomechanical strapping should benefit from functional orthoses at this time orthotics skin is carried out for functional orthoses continue with MOBIC and ice fascial strapping 3 applied at this time as well we discussed steroid injection since he still and tenderness over will followup if needed again skin and fascial strapping reapplied today be checked within 4 weeks for new functional orthoses to be dispensed  Harriet Masson DPM

## 2014-07-07 ENCOUNTER — Ambulatory Visit (INDEPENDENT_AMBULATORY_CARE_PROVIDER_SITE_OTHER): Payer: BC Managed Care – PPO | Admitting: *Deleted

## 2014-07-07 DIAGNOSIS — M722 Plantar fascial fibromatosis: Secondary | ICD-10-CM

## 2014-07-07 NOTE — Progress Notes (Signed)
Patient requested an adjustment to the orthotics. Felt they were too wide and arch too high. Let patient take the orthotics home for the weekend to try in other shoes. Asked if he could call us with feedback on Monday.

## 2014-07-07 NOTE — Patient Instructions (Signed)

## 2015-01-02 ENCOUNTER — Other Ambulatory Visit: Payer: Self-pay | Admitting: Family Medicine

## 2015-01-02 NOTE — Telephone Encounter (Signed)
Medication request received for tramadol.  Pt has not been seen since 03/17/13.  Medication refused.  Pt needs OV.

## 2015-01-18 ENCOUNTER — Other Ambulatory Visit: Payer: Self-pay | Admitting: Family Medicine

## 2015-01-18 MED ORDER — TRAMADOL HCL 50 MG PO TABS: ORAL_TABLET | ORAL | Status: DC

## 2017-07-21 DIAGNOSIS — B079 Viral wart, unspecified: Secondary | ICD-10-CM | POA: Diagnosis not present

## 2017-11-19 ENCOUNTER — Telehealth: Payer: Self-pay | Admitting: Family Medicine

## 2017-11-19 NOTE — Telephone Encounter (Signed)
Pt needs office visit. LOV 2014

## 2017-11-19 NOTE — Telephone Encounter (Signed)
Copied from [place]. Topic: Quick Communication - Rx Refill/Question >> Nov 19, 2017 12:22 PM Oliver Pila B wrote: Medication:  traMADol (ULTRAM) 50 MG tablet [[id number]]    Has the patient contacted their pharmacy? Yes.     (Agent: If no, request that the patient contact the pharmacy for the refill.)   Preferred Pharmacy (with phone number or street name): [facility]   Agent: Please be advised that RX refills may take up to 3 business days. We ask that you follow-up with your pharmacy.

## 2017-11-19 NOTE — Telephone Encounter (Signed)
Pt has not been seen since 2014, will need 30 min office visit. Pt aware and will call back to schedule apt.

## 2019-02-28 DIAGNOSIS — R0789 Other chest pain: Secondary | ICD-10-CM

## 2019-02-28 HISTORY — DX: Other chest pain: R07.89

## 2019-03-23 ENCOUNTER — Other Ambulatory Visit: Payer: Self-pay

## 2019-03-23 ENCOUNTER — Ambulatory Visit: Payer: BLUE CROSS/BLUE SHIELD | Admitting: Family Medicine

## 2019-03-23 ENCOUNTER — Encounter: Payer: Self-pay | Admitting: Family Medicine

## 2019-03-23 ENCOUNTER — Telehealth: Payer: Self-pay | Admitting: Family Medicine

## 2019-03-23 VITALS — BP 114/61 | HR 97 | Temp 97.9°F | Resp 12 | Ht 74.0 in | Wt 241.8 lb

## 2019-03-23 DIAGNOSIS — F4323 Adjustment disorder with mixed anxiety and depressed mood: Secondary | ICD-10-CM

## 2019-03-23 DIAGNOSIS — R0789 Other chest pain: Secondary | ICD-10-CM | POA: Diagnosis not present

## 2019-03-23 DIAGNOSIS — R000 Tachycardia, unspecified: Secondary | ICD-10-CM | POA: Diagnosis not present

## 2019-03-23 MED ORDER — ALPRAZOLAM 0.5 MG PO TABS: ORAL_TABLET | ORAL | 0 refills | Status: DC

## 2019-03-23 NOTE — Telephone Encounter (Signed)
PCP approved in office visit. Contacted patient to let him know that Dr. Anitra Lauth agreed to see patient in office at 1:30pm.

## 2019-03-23 NOTE — Telephone Encounter (Signed)
Please advise, thanks.

## 2019-03-23 NOTE — Telephone Encounter (Signed)
Wife called in to make husband appt. States that he has had chest pain for the last 2 days, he thinks it may be just indigestion. He has not been seen since 2014.  I talked to Clinical Supervisor, she told me to call patient back and recommend he go to the ER. He is a "new patient". He has not been here in 6 years.  I called patient back, he is refused to go to ER. I told him that he would be a new patient. He argued the fact. He said Dr. Anitra Lauth knows his entire history. He requested Dr. Anitra Lauth to review his chart and then tell him what he needs to do.   Please contact as soon as possible.

## 2019-03-23 NOTE — Telephone Encounter (Signed)
Agree 
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
## 2019-03-24 LAB — COMPREHENSIVE METABOLIC PANEL
ALT: 46 U/L (ref 0–53)
AST: 23 U/L (ref 0–37)
Albumin: 4.8 g/dL (ref 3.5–5.2)
Alkaline Phosphatase: 52 U/L (ref 39–117)
BUN: 16 mg/dL (ref 6–23)
CO2: 26 mEq/L (ref 19–32)
Calcium: 10.6 mg/dL — ABNORMAL HIGH (ref 8.4–10.5)
Chloride: 102 mEq/L (ref 96–112)
Creatinine, Ser: 0.85 mg/dL (ref 0.40–1.50)
GFR: 96.39 mL/min (ref >60.00)
Glucose, Bld: 98 mg/dL (ref 70–99)
Potassium: 4.2 mEq/L (ref 3.5–5.1)
Sodium: 139 mEq/L (ref 135–145)
Total Bilirubin: 2 mg/dL — ABNORMAL HIGH (ref 0.2–1.2)
Total Protein: 7.3 g/dL (ref 6.0–8.3)

## 2019-03-24 LAB — CBC WITH DIFFERENTIAL/PLATELET
Basophils Absolute: 0.1 10*3/uL (ref 0.0–0.1)
Basophils Relative: 1.2 % (ref 0.0–3.0)
Eosinophils Absolute: 0.1 10*3/uL (ref 0.0–0.7)
Eosinophils Relative: 1.1 % (ref 0.0–5.0)
HCT: 47.9 % (ref 39.0–52.0)
Hemoglobin: 16.2 g/dL (ref 13.0–17.0)
Lymphocytes Relative: 20.7 % (ref 12.0–46.0)
Lymphs Abs: 1.7 10*3/uL (ref 0.7–4.0)
MCHC: 33.8 g/dL (ref 30.0–36.0)
MCV: 86.5 fl (ref 78.0–100.0)
Monocytes Absolute: 0.4 10*3/uL (ref 0.1–1.0)
Monocytes Relative: 5.5 % (ref 3.0–12.0)
Neutro Abs: 5.8 10*3/uL (ref 1.4–7.7)
Neutrophils Relative %: 71.5 % (ref 43.0–77.0)
Platelets: 236 10*3/uL (ref 150.0–400.0)
RBC: 5.54 Mil/uL (ref 4.22–5.81)
RDW: 13.7 % (ref 11.5–15.5)
WBC: 8.1 10*3/uL (ref 4.0–10.5)

## 2019-03-24 LAB — TSH: TSH: 0.77 u[IU]/mL (ref 0.35–4.50)

## 2019-04-06 ENCOUNTER — Ambulatory Visit: Payer: BLUE CROSS/BLUE SHIELD | Admitting: Family Medicine

## 2019-04-06 ENCOUNTER — Encounter: Payer: Self-pay | Admitting: Family Medicine

## 2019-04-06 ENCOUNTER — Other Ambulatory Visit: Payer: Self-pay

## 2019-04-06 VITALS — BP 110/70 | HR 79 | Temp 98.2°F | Resp 16 | Ht 74.0 in | Wt 248.0 lb

## 2019-04-06 DIAGNOSIS — G43009 Migraine without aura, not intractable, without status migrainosus: Secondary | ICD-10-CM | POA: Diagnosis not present

## 2019-04-06 DIAGNOSIS — F4323 Adjustment disorder with mixed anxiety and depressed mood: Secondary | ICD-10-CM

## 2019-04-06 DIAGNOSIS — G4700 Insomnia, unspecified: Secondary | ICD-10-CM | POA: Diagnosis not present

## 2019-04-06 MED ORDER — TRAMADOL HCL 50 MG PO TABS: ORAL_TABLET | ORAL | 0 refills | Status: DC

## 2019-04-06 MED ORDER — PROPRANOLOL HCL 40 MG PO TABS: ORAL_TABLET | ORAL | 1 refills | Status: DC
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
## 2019-04-22 ENCOUNTER — Encounter: Payer: Self-pay | Admitting: Family Medicine

## 2019-04-26 ENCOUNTER — Telehealth: Payer: Self-pay

## 2019-04-26 NOTE — Telephone Encounter (Signed)
Received pt's health form via [facility] on 04/22/19. Pt was made aware he is due for physical and form can be completed once labs are done and results are back. His form is on my desk until CPE and labs completed.

## 2019-04-29 ENCOUNTER — Ambulatory Visit: Payer: BLUE CROSS/BLUE SHIELD | Admitting: Family Medicine

## 2019-04-29 ENCOUNTER — Encounter: Payer: Self-pay | Admitting: Family Medicine

## 2019-04-29 ENCOUNTER — Other Ambulatory Visit: Payer: Self-pay

## 2019-04-29 VITALS — BP 116/68 | HR 114 | Temp 97.8°F | Resp 16 | Ht 74.0 in | Wt 252.4 lb

## 2019-04-29 DIAGNOSIS — Z0000 Encounter for general adult medical examination without abnormal findings: Secondary | ICD-10-CM | POA: Diagnosis not present

## 2019-04-29 DIAGNOSIS — F4323 Adjustment disorder with mixed anxiety and depressed mood: Secondary | ICD-10-CM | POA: Diagnosis not present

## 2019-04-29 DIAGNOSIS — Z23 Encounter for immunization: Secondary | ICD-10-CM | POA: Diagnosis not present

## 2019-04-29 NOTE — Addendum Note (Signed)
Addended by: Deveron Furlong D on: 04/29/2019 04:04 PM   Modules accepted: Orders
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
## 2019-04-29 NOTE — Patient Instructions (Signed)
Health Maintenance, Male Adopting a healthy lifestyle and getting preventive care are important in promoting health and wellness. Ask your health care provider about:  The right schedule for you to have regular tests and exams.  Things you can do on your own to prevent diseases and keep yourself healthy. What should I know about diet, weight, and exercise? Eat a healthy diet   Eat a diet that includes plenty of vegetables, fruits, low-fat dairy products, and lean protein.  Do not eat a lot of foods that are high in solid fats, added sugars, or sodium. Maintain a healthy weight Body mass index (BMI) is a measurement that can be used to identify possible weight problems. It estimates body fat based on height and weight. Your health care provider can help determine your BMI and help you achieve or maintain a healthy weight. Get regular exercise Get regular exercise. This is one of the most important things you can do for your health. Most adults should:  Exercise for at least 150 minutes each week. The exercise should increase your heart rate and make you sweat (moderate-intensity exercise).  Do strengthening exercises at least twice a week. This is in addition to the moderate-intensity exercise.  Spend less time sitting. Even light physical activity can be beneficial. Watch cholesterol and blood lipids Have your blood tested for lipids and cholesterol at 48 years of age, then have this test every 5 years. You may need to have your cholesterol levels checked more often if:  Your lipid or cholesterol levels are high.  You are older than 48 years of age.  You are at high risk for heart disease. What should I know about cancer screening? Many types of cancers can be detected early and may often be prevented. Depending on your health history and family history, you may need to have cancer screening at various ages. This may include screening for:  Colorectal cancer.  Prostate  cancer.  Skin cancer.  Lung cancer. What should I know about heart disease, diabetes, and high blood pressure? Blood pressure and heart disease  High blood pressure causes heart disease and increases the risk of stroke. This is more likely to develop in people who have high blood pressure readings, are of African descent, or are overweight.  Talk with your health care provider about your target blood pressure readings.  Have your blood pressure checked: ? Every 3-5 years if you are 30-46 years of age. ? Every year if you are 78 years old or older.  If you are between the ages of 69 and 47 and are a current or former smoker, ask your health care provider if you should have a one-time screening for abdominal aortic aneurysm (AAA). Diabetes Have regular diabetes screenings. This checks your fasting blood sugar level. Have the screening done:  Once every three years after age 60 if you are at a normal weight and have a low risk for diabetes.  More often and at a younger age if you are overweight or have a high risk for diabetes. What should I know about preventing infection? Hepatitis B If you have a higher risk for hepatitis B, you should be screened for this virus. Talk with your health care provider to find out if you are at risk for hepatitis B infection. Hepatitis C Blood testing is recommended for:  Everyone born from [birth year] through 1965.  Anyone with known risk factors for hepatitis C. Sexually transmitted infections (STIs)  You should be screened each year  for STIs, including gonorrhea and chlamydia, if: ? You are sexually active and are younger than 48 years of age. ? You are older than 48 years of age and your health care provider tells you that you are at risk for this type of infection. ? Your sexual activity has changed since you were last screened, and you are at increased risk for chlamydia or gonorrhea. Ask your health care provider if you are at risk.  Ask your  health care provider about whether you are at high risk for HIV. Your health care provider may recommend a prescription medicine to help prevent HIV infection. If you choose to take medicine to prevent HIV, you should first get tested for HIV. You should then be tested every 3 months for as long as you are taking the medicine. Follow these instructions at home: Lifestyle  Do not use any products that contain nicotine or tobacco, such as cigarettes, e-cigarettes, and chewing tobacco. If you need help quitting, ask your health care provider.  Do not use street drugs.  Do not share needles.  Ask your health care provider for help if you need support or information about quitting drugs. Alcohol use  Do not drink alcohol if your health care provider tells you not to drink.  If you drink alcohol: ? Limit how much you have to 0-2 drinks a day. ? Be aware of how much alcohol is in your drink. In the [place] one drink equals one 12 oz bottle of beer (355 mL), one 5 oz glass of wine (148 mL), or one 1 oz glass of hard liquor (44 mL). General instructions  Schedule regular health, dental, and eye exams.  Stay current with your vaccines.  Tell your health care provider if: ? You often feel depressed. ? You have ever been abused or do not feel safe at home. Summary  Adopting a healthy lifestyle and getting preventive care are important in promoting health and wellness.  Follow your health care provider's instructions about healthy diet, exercising, and getting tested or screened for diseases.  Follow your health care provider's instructions on monitoring your cholesterol and blood pressure. This information is not intended to replace advice given to you by your health care provider. Make sure you discuss any questions you have with your health care provider. Document Released: 03/13/2008 Document Revised: 09/08/2018 Document Reviewed: 09/08/2018 Elsevier Patient Education  2020 [employer].

## 2019-05-05 ENCOUNTER — Ambulatory Visit: Payer: BLUE CROSS/BLUE SHIELD | Admitting: Family Medicine

## 2019-05-09 ENCOUNTER — Other Ambulatory Visit: Payer: Self-pay

## 2019-05-09 ENCOUNTER — Ambulatory Visit (INDEPENDENT_AMBULATORY_CARE_PROVIDER_SITE_OTHER): Payer: BC Managed Care – PPO | Admitting: Family Medicine

## 2019-05-09 DIAGNOSIS — Z0000 Encounter for general adult medical examination without abnormal findings: Secondary | ICD-10-CM

## 2019-05-09 LAB — HEPATIC FUNCTION PANEL
ALT: 26 U/L (ref 0–53)
AST: 18 U/L (ref 0–37)
Albumin: 4.8 g/dL (ref 3.5–5.2)
Alkaline Phosphatase: 56 U/L (ref 39–117)
Bilirubin, Direct: 0.2 mg/dL (ref 0.0–0.3)
Total Bilirubin: 1.3 mg/dL — ABNORMAL HIGH (ref 0.2–1.2)
Total Protein: 7.2 g/dL (ref 6.0–8.3)

## 2019-05-09 LAB — LIPID PANEL
Cholesterol: 199 mg/dL (ref 0–200)
HDL: 50.8 mg/dL (ref >39.00)
LDL Cholesterol: 134 mg/dL — ABNORMAL HIGH (ref 0–99)
NonHDL: 147.77
Total CHOL/HDL Ratio: 4
Triglycerides: 69 mg/dL (ref 0.0–149.0)
VLDL: 13.8 mg/dL (ref 0.0–40.0)

## 2019-05-10 ENCOUNTER — Encounter: Payer: Self-pay | Admitting: Family Medicine

## 2019-05-10 ENCOUNTER — Telehealth: Payer: Self-pay

## 2019-05-10 NOTE — Telephone Encounter (Signed)
MyChart message sent with results.

## 2020-03-07 DIAGNOSIS — L237 Allergic contact dermatitis due to plants, except food: Secondary | ICD-10-CM | POA: Diagnosis not present

## 2020-03-26 DIAGNOSIS — H3561 Retinal hemorrhage, right eye: Secondary | ICD-10-CM | POA: Diagnosis not present

## 2020-03-26 DIAGNOSIS — H4311 Vitreous hemorrhage, right eye: Secondary | ICD-10-CM | POA: Diagnosis not present

## 2020-03-27 DIAGNOSIS — H4311 Vitreous hemorrhage, right eye: Secondary | ICD-10-CM | POA: Diagnosis not present

## 2020-03-27 DIAGNOSIS — H35051 Retinal neovascularization, unspecified, right eye: Secondary | ICD-10-CM | POA: Diagnosis not present

## 2020-03-27 DIAGNOSIS — H35371 Puckering of macula, right eye: Secondary | ICD-10-CM | POA: Diagnosis not present

## 2020-03-27 DIAGNOSIS — H34231 Retinal artery branch occlusion, right eye: Secondary | ICD-10-CM | POA: Diagnosis not present

## 2020-03-29 DIAGNOSIS — H35371 Puckering of macula, right eye: Secondary | ICD-10-CM | POA: Diagnosis not present

## 2020-03-29 DIAGNOSIS — H34231 Retinal artery branch occlusion, right eye: Secondary | ICD-10-CM | POA: Diagnosis not present

## 2020-03-29 DIAGNOSIS — H4311 Vitreous hemorrhage, right eye: Secondary | ICD-10-CM | POA: Diagnosis not present

## 2020-03-30 DIAGNOSIS — H4311 Vitreous hemorrhage, right eye: Secondary | ICD-10-CM | POA: Diagnosis not present

## 2020-03-30 DIAGNOSIS — H34231 Retinal artery branch occlusion, right eye: Secondary | ICD-10-CM | POA: Diagnosis not present

## 2020-03-30 DIAGNOSIS — H35371 Puckering of macula, right eye: Secondary | ICD-10-CM | POA: Diagnosis not present

## 2020-03-30 DIAGNOSIS — H35051 Retinal neovascularization, unspecified, right eye: Secondary | ICD-10-CM | POA: Diagnosis not present

## 2020-04-06 DIAGNOSIS — H35371 Puckering of macula, right eye: Secondary | ICD-10-CM | POA: Diagnosis not present

## 2020-04-24 DIAGNOSIS — H35371 Puckering of macula, right eye: Secondary | ICD-10-CM | POA: Diagnosis not present

## 2020-04-24 DIAGNOSIS — H34231 Retinal artery branch occlusion, right eye: Secondary | ICD-10-CM | POA: Diagnosis not present

## 2020-05-30 DIAGNOSIS — U071 COVID-19: Secondary | ICD-10-CM

## 2020-05-30 HISTORY — DX: COVID-19: U07.1

## 2020-06-01 DIAGNOSIS — U071 COVID-19: Secondary | ICD-10-CM | POA: Insufficient documentation

## 2020-06-05 DIAGNOSIS — U071 COVID-19: Secondary | ICD-10-CM | POA: Diagnosis not present

## 2020-06-05 DIAGNOSIS — Z20822 Contact with and (suspected) exposure to covid-19: Secondary | ICD-10-CM | POA: Diagnosis not present

## 2020-06-08 DIAGNOSIS — R05 Cough: Secondary | ICD-10-CM | POA: Diagnosis not present

## 2020-06-08 DIAGNOSIS — J9801 Acute bronchospasm: Secondary | ICD-10-CM | POA: Diagnosis not present

## 2020-06-08 DIAGNOSIS — Z9189 Other specified personal risk factors, not elsewhere classified: Secondary | ICD-10-CM | POA: Diagnosis not present

## 2020-06-08 DIAGNOSIS — U071 COVID-19: Secondary | ICD-10-CM | POA: Diagnosis not present

## 2020-06-13 DIAGNOSIS — U071 COVID-19: Secondary | ICD-10-CM | POA: Diagnosis not present

## 2020-06-13 DIAGNOSIS — Z9189 Other specified personal risk factors, not elsewhere classified: Secondary | ICD-10-CM | POA: Diagnosis not present

## 2020-06-13 DIAGNOSIS — R05 Cough: Secondary | ICD-10-CM | POA: Diagnosis not present

## 2020-06-14 DIAGNOSIS — R0602 Shortness of breath: Secondary | ICD-10-CM | POA: Diagnosis not present

## 2020-06-14 DIAGNOSIS — Z03818 Encounter for observation for suspected exposure to other biological agents ruled out: Secondary | ICD-10-CM | POA: Diagnosis not present

## 2020-06-14 DIAGNOSIS — R9431 Abnormal electrocardiogram [ECG] [EKG]: Secondary | ICD-10-CM | POA: Diagnosis not present

## 2020-06-14 DIAGNOSIS — U071 COVID-19: Secondary | ICD-10-CM | POA: Diagnosis not present

## 2020-06-14 DIAGNOSIS — R918 Other nonspecific abnormal finding of lung field: Secondary | ICD-10-CM | POA: Diagnosis not present

## 2020-06-15 ENCOUNTER — Telehealth: Payer: Self-pay

## 2020-06-15 NOTE — Telephone Encounter (Signed)
Please advise, thanks.

## 2020-06-15 NOTE — Telephone Encounter (Signed)
Henry [name], can you assist please?

## 2020-06-15 NOTE — Telephone Encounter (Signed)
Patient wife Amy (DPR) requesting virtual visit with Dr. Anitra Lauth today.  Patient tested + for COVID on 06/01/20 and still has cough.  Wife concerned that its in his lungs now.  Hard for him breath, because of coughing non stop.  Wife states he is not getting any better.  Please advise.  There are blocked appts for today. Can we schedule him for a virtual visit or possibly covid clinic?    Please call wife Amy [phone]

## 2020-06-15 NOTE — Telephone Encounter (Signed)
Definitely covid clinic-thx

## 2020-06-16 ENCOUNTER — Telehealth: Payer: BC Managed Care – PPO | Admitting: Family Medicine

## 2020-06-17 DIAGNOSIS — R000 Tachycardia, unspecified: Secondary | ICD-10-CM | POA: Diagnosis not present

## 2020-06-17 DIAGNOSIS — R17 Unspecified jaundice: Secondary | ICD-10-CM | POA: Diagnosis not present

## 2020-06-17 DIAGNOSIS — R05 Cough: Secondary | ICD-10-CM | POA: Diagnosis not present

## 2020-06-17 DIAGNOSIS — J189 Pneumonia, unspecified organism: Secondary | ICD-10-CM | POA: Diagnosis not present

## 2020-06-17 DIAGNOSIS — R9431 Abnormal electrocardiogram [ECG] [EKG]: Secondary | ICD-10-CM | POA: Diagnosis not present

## 2020-06-17 DIAGNOSIS — U071 COVID-19: Secondary | ICD-10-CM | POA: Diagnosis not present

## 2020-06-17 DIAGNOSIS — J80 Acute respiratory distress syndrome: Secondary | ICD-10-CM | POA: Diagnosis not present

## 2020-06-17 DIAGNOSIS — R0602 Shortness of breath: Secondary | ICD-10-CM | POA: Diagnosis not present

## 2020-06-18 DIAGNOSIS — R9431 Abnormal electrocardiogram [ECG] [EKG]: Secondary | ICD-10-CM | POA: Diagnosis not present

## 2020-06-19 DIAGNOSIS — R0602 Shortness of breath: Secondary | ICD-10-CM | POA: Diagnosis not present

## 2020-06-19 DIAGNOSIS — R945 Abnormal results of liver function studies: Secondary | ICD-10-CM | POA: Diagnosis not present

## 2020-06-19 DIAGNOSIS — R9431 Abnormal electrocardiogram [ECG] [EKG]: Secondary | ICD-10-CM | POA: Diagnosis not present

## 2020-06-19 DIAGNOSIS — U071 COVID-19: Secondary | ICD-10-CM | POA: Diagnosis not present

## 2020-06-22 ENCOUNTER — Other Ambulatory Visit: Payer: Self-pay

## 2020-06-23 ENCOUNTER — Telehealth: Payer: Self-pay | Admitting: Family Medicine

## 2020-06-23 DIAGNOSIS — R064 Hyperventilation: Secondary | ICD-10-CM | POA: Diagnosis not present

## 2020-06-23 DIAGNOSIS — R05 Cough: Secondary | ICD-10-CM | POA: Diagnosis not present

## 2020-06-23 DIAGNOSIS — R0602 Shortness of breath: Secondary | ICD-10-CM | POA: Diagnosis not present

## 2020-06-23 NOTE — Telephone Encounter (Signed)
Call received from on call service. They noted the patient wanted to speak with the on call provider regarding his symptoms as he was in the ED earlier today and discharged home. They connected me to the patients wife with the patient in the background of the call.  Patient was diagnosed with Covid on 06/05/2020.  The wife noted he did not have a difficult initial course with Covid.  They note that over the last week and a half he has had trouble breathing and feeling like his lungs do not expand or contracted appropriately.  She notes that he has difficulty yawning due to this.  He has had chest pressure with this as well.  They presented to an outside ED on 2 occasions on 06/17/2020.  During the first visit they did a CT angiogram to rule out PE.  There was no PE seen though they did see Covid pneumonia.  He was apparently hypoxic prior to arrival in the ED though was not hypoxic at all in the ED.  EKG with incomplete right bundle branch block.  Per ED note troponin was negative at that time.  Procalcitonin level was normal.  CRP was normal.  D-dimer was normal.  Based on ED note there were no significant lab abnormalities.  He was given IV dexamethasone and started on IV fluids and was advised to return to the ED if his oxygen stayed below 94%.  He presented to an outside ED that same day for reevaluation given O2 sat of 91% and persistent symptoms.  He was encouraged to complete his course of antibiotics and advised to return to the ED if his oxygen readings are sustained in the high 80s.  He was advised to follow-up with his PCP as well at that time.  He has continued to feel poorly through today and has had some mild chest pressure as well as elevated heart rate up to about 150.  Oxygen saturations have been 98-99%.  He presented to another outside hospital for evaluation earlier today and had a chest x-ray which was reassuring.  They also did a blood gas that did not reveal any acidosis.  Per the patient he was  advised that he was having an anxiety attack and it appears they diagnosed him with hyperventilation and was prescribed Xanax and advised to follow-up with his PCP.  His vitals were reassuring in the ED.  Patient notes he has been using budesonide and albuterol with little benefit.  They note he has trouble breathing if he lays on his back and on his stomach.  He continues to feel as though his lungs do not expand or contract adequately.  O2 sats at home of been 98-99%.  They note his cough and congestion have gotten pretty much all the way better.  His wife notes he just feels like he cannot get a deep enough breath in.  She notes that he does have a prednisone taper at home starting at 30 mg on day 6 and tapering by 5 mg that he could take.  I discussed that it is difficult to evaluate this kind of symptom over the phone as I do not have the ability to examine the patient or obtain lab work.  It would have been helpful if they had completed a more extensive work-up in the ED.  It is reassuring that he has had symptoms for over a week and a half and did have a negative work-up in the ED on 06/17/2020 and had reassuring  vitals earlier today in the ED.  His symptoms are likely related to the more long-term effects of Covid.  Discussed that they could try the prednisone and see if that provides any benefit though I did note that if he has no improvement in symptoms or if he has any worsening symptoms or persistence of his chest discomfort he should go back to the ED to be evaluated.  I also discussed that I would recommend against using the Xanax given his trouble breathing as it may cause respiratory depression.  They verbalized understanding.  I additionally discussed that he may benefit from going to the post Covid clinic in [place] and I will forward this message to his PCP that he is aware of what is going on with the patient and can determine need for this referral.  He also would benefit from follow-up with  his PCP this week.

## 2020-06-24 NOTE — Telephone Encounter (Signed)
Pls arrange for pt to be seen in the post-covid clinic ASAP. If he would like to f/u with me as well then put him in first available opening in schedule.

## 2020-06-25 ENCOUNTER — Telehealth: Payer: Self-pay | Admitting: Family Medicine

## 2020-06-25 ENCOUNTER — Ambulatory Visit: Payer: BC Managed Care – PPO | Admitting: Family Medicine

## 2020-06-25 DIAGNOSIS — R0602 Shortness of breath: Secondary | ICD-10-CM | POA: Diagnosis not present

## 2020-06-25 DIAGNOSIS — U071 COVID-19: Secondary | ICD-10-CM | POA: Diagnosis not present

## 2020-06-25 NOTE — Telephone Encounter (Signed)
Unable to expand lungs, lightheaded. Advised to go to ED but a little skeptical due to already going 3 times and not really getting any relief. He still wants appt for post covid center tomorrow.   Please advise, thanks.

## 2020-06-25 NOTE — Telephone Encounter (Signed)
Spoke with patient's wife, Amy and advised no referral needed currently. He will be seen by pulmonologist.

## 2020-06-25 NOTE — Telephone Encounter (Signed)
OK to not go to ED. Pls definitely set him up with post-covid clinic ASAP.-thx

## 2020-06-25 NOTE — Telephone Encounter (Signed)
Henry [name] is calling in on behalf of Henry [name], states that he is needing a referral to a pulmonologist as he has been in/out with breathing issues. Patient's wife states he has been having trouble breathing, feels like he is drowning, unable to catch his breath, and feels as if his lungs are frozen -did try to have wife speak with triage but declined saying she spoke with them this weekend and they advised to go to ED.

## 2020-06-25 NOTE — Telephone Encounter (Signed)
Pt has already been talked with to resolve issue at hand

## 2020-06-25 NOTE — Telephone Encounter (Signed)
Post covid clinic appt made for Thursday, 9/30 @ 11. It was the soonest available. He is currently at [facility] ED for evaluation. Advised to call by 1:30 to let us know if keeping 2pm appt. Wife wanted to know if you could do referral for pulmonology.

## 2020-06-25 NOTE — Telephone Encounter (Signed)
The post-covid clinic eval is done by a pulmonologist so I'll put off any pulm referral at this time.

## 2020-06-28 ENCOUNTER — Ambulatory Visit (INDEPENDENT_AMBULATORY_CARE_PROVIDER_SITE_OTHER): Payer: BC Managed Care – PPO | Admitting: Nurse Practitioner

## 2020-06-28 VITALS — BP 128/88 | HR 64 | Temp 97.5°F | Wt 240.0 lb

## 2020-06-28 DIAGNOSIS — Z8616 Personal history of COVID-19: Secondary | ICD-10-CM

## 2020-06-28 DIAGNOSIS — R0602 Shortness of breath: Secondary | ICD-10-CM | POA: Diagnosis not present

## 2020-06-28 DIAGNOSIS — R002 Palpitations: Secondary | ICD-10-CM | POA: Diagnosis not present

## 2020-06-28 NOTE — Patient Instructions (Addendum)
Covid 19 Congestion:  Continue inhalers and nebulizer as needed as needed  Stay well hydrated  Stay active  Deep breathing exercises  May start vitamin C 2,000 mg daily, vitamin D3 2,000 IU daily, Zinc 220 mg daily, and Quercetin 500 mg twice daily  May take mucinex DM twice daily  May start zyrtec  Will order chest x ray  Will place referral to pulmonary  Will place referral to cardiology  Will place referral for PT   Follow up:  Follow up in 1 month or sooner if needed

## 2020-06-28 NOTE — Progress Notes (Signed)
@Patient  ID: Henry [name], male    DOB: December 10, 1970, 49 y.o.   MRN: [id number]  Chief Complaint  Patient presents with   New Patient (Initial Visit)    COVID Pos 9/3 Sx:SOB, Congestion has been to the ED multiple times.     Referring provider: Tammi Sou, MD   49 year old male with history of migraine, retinal artery occlusion, Peyronie's disease, anxiety, insomnia.  Diagnosed with Covid on 06/01/2020.  HPI  Patient presents today for post COVID care clinic visit.  Patient was diagnosed with Covid on 06/01/2020.  He has had multiple ED visits since that time.  He complains of ongoing shortness of breath with exertion he states that he wakes up at night gasping for breath.  He has been doing nebulizer treatments twice daily, Rhinocort, Nettie pot.  Patient has had 2 rounds of azithromycin and prednisone.  Patient is trying to stay active.  He is doing deep breathing exercises.  His last chest x-ray was clear.  Patient was walked in the office today and O2 sats dropped to 94% on room air and heart rate was elevated at 115 bpm with minimal exertion.  Patient does complain of heart palpitations and feeling of heart racing. Denies f/c/s, n/v/d, hemoptysis, PND, chest pain or edema.       No Known Allergies  Immunization History  Administered Date(s) Administered   Influenza Split 09/09/2011, 06/18/2012   PPD Test 02/04/2012   Tdap 04/29/2019    Past Medical History:  Diagnosis Date   Adjustment disorder with mixed emotional features    Atypical chest pain 02/2019   Insomnia    Migraine syndrome    Peyronie's disease 01/2013   Urology: Vit E 400 U/day   Retinal artery occlusion 01/2012   Cardiac event monitor neg; carotid dopplers and transthoracic echo normal.  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   albuterol (ACCUNEB) 1.25 MG/3ML nebulizer solution Take by nebulization.   albuterol (VENTOLIN HFA) 108 (90 Base) MCG/ACT inhaler Inhale 2 puffs into the lungs every 4 (four) hours as needed.   ALPRAZolam (XANAX) 0.5 MG tablet 1-2 tabs po tid prn (Patient not taking: Reported on 04/29/2019)   b complex vitamins capsule Take 1 capsule by mouth daily.   benzonatate (TESSALON) 100 MG capsule Take 100 mg by mouth 3 (three) times daily.   budesonide (PULMICORT) 1 MG/2ML nebulizer solution Take 2 mLs (1 mg total) by nebulization daily.   budesonide-formoterol (SYMBICORT) 80-4.5 MCG/ACT inhaler Inhale 2 puffs into the lungs 2 (two) times daily.   Cholecalciferol (VITAMIN D3 ADULT GUMMIES PO) Take by mouth.   [employer] D3 50 MCG (2000 UT) CAPS Take 1 capsule by mouth daily.   [employer]S SALINE NASAL SPRAY 0.65 % nasal spray SMARTSIG:Spray(s) Both Nares   [employer]S VITAMIN C 1000 MG tablet Take 2,000 mg by mouth daily.   ELDERBERRY PO Take by mouth. Take 1 gummy daily.   fluticasone (FLONASE) 50 MCG/ACT nasal spray Place into the nose.   Multiple Vitamin (MULTIVITAMIN WITH MINERALS) TABS Take 1 tablet by mouth daily.   predniSONE (DELTASONE) 5 MG tablet Take by mouth.   propranolol (INDERAL) 40 MG  tablet 1 tab po tid prn anxiety (Patient not taking: Reported on 04/29/2019)   traMADol (ULTRAM) 50 MG tablet 1-2 tabs po qd as needed for a persistent headache (Patient not taking: Reported on 04/29/2019)   triamcinolone cream (KENALOG) 0.1 % Apply topically daily.   vitamin E 400 UNIT capsule Take 400 Units by mouth daily.   [DISCONTINUED] budesonide (PULMICORT) 1 MG/2ML nebulizer solution    No facility-administered encounter medications on file as of 06/28/2020.     Review of Systems  Review of Systems  Constitutional:  Negative.  Negative for fever.  HENT: Negative.   Respiratory: Positive for cough and shortness of breath.   Cardiovascular: Positive for palpitations. Negative for chest pain and leg swelling.  Gastrointestinal: Negative.   Allergic/Immunologic: Negative.   Neurological: Negative.   Psychiatric/Behavioral: Negative.        Physical Exam  BP 128/88 (BP Location: Right Arm)    Pulse 64    Temp (!) 97.5 F (36.4 C)    Wt 240 lb 0.1 oz (108.9 kg)    SpO2 97%    BMI 30.82 kg/m   Wt Readings from Last 5 Encounters:  06/28/20 240 lb 0.1 oz (108.9 kg)  04/29/19 252 lb 6.4 oz (114.5 kg)  04/06/19 248 lb (112.5 kg)  03/23/19 241 lb 12.8 oz (109.7 kg)  05/19/14 245 lb (111.1 kg)     Physical Exam Vitals and nursing note reviewed.  Constitutional:      General: He is not in acute distress.    Appearance: He is well-developed.  Cardiovascular:     Rate and Rhythm: Regular rhythm. Tachycardia present.  Pulmonary:     Effort: Pulmonary effort is normal.     Breath sounds: Normal breath sounds.  Skin:    General: Skin is warm and dry.  Neurological:     Mental Status: He is alert and oriented to person, place, and time.      Lab Results:  CBC    Component Value Date/Time   WBC 8.1 03/23/2019 1428   RBC 5.54 03/23/2019 1428   HGB 16.2 03/23/2019 1428   HCT 47.9 03/23/2019 1428   PLT 236.0 03/23/2019 1428   MCV 86.5 03/23/2019 1428   MCH 29.6 01/29/2012 0848   MCHC 33.8 03/23/2019 1428   RDW 13.7 03/23/2019 1428   LYMPHSABS 1.7 03/23/2019 1428   MONOABS 0.4 03/23/2019 1428   EOSABS 0.1 03/23/2019 1428   BASOSABS 0.1 03/23/2019 1428    BMET    Component Value Date/Time   NA 139 03/23/2019 1428   K 4.2 03/23/2019 1428   CL 102 03/23/2019 1428   CO2 26 03/23/2019 1428   GLUCOSE 98 03/23/2019 1428   BUN 16 03/23/2019 1428   CREATININE 0.85 03/23/2019 1428   CREATININE 0.97 01/29/2012 0848   CALCIUM 10.6 (H) 03/23/2019 1428   GFRNONAA >60 04/11/2008 0620    GFRAA  04/11/2008 0620    >60        The eGFR has been calculated using the MDRD equation. This calculation has not been validated in all clinical    BNP No results found for: BNP  ProBNP No results found for: PROBNP  Imaging: No results found.   Assessment & Plan:   No problem-specific Assessment & Plan notes found for this encounter.     Fenton Foy, NP 06/29/2020

## 2020-06-29 DIAGNOSIS — R9431 Abnormal electrocardiogram [ECG] [EKG]: Secondary | ICD-10-CM | POA: Diagnosis not present

## 2020-06-29 MED ORDER — BUDESONIDE-FORMOTEROL FUMARATE 80-4.5 MCG/ACT IN AERO: 2.0000 | INHALATION_SPRAY | Freq: Two times a day (BID) | RESPIRATORY_TRACT | 3 refills | Status: DC

## 2020-06-29 MED ORDER — BUDESONIDE 1 MG/2ML IN SUSP: 1.0000 mg | Freq: Every day | RESPIRATORY_TRACT | 0 refills | Status: DC

## 2020-07-02 ENCOUNTER — Other Ambulatory Visit: Payer: Self-pay

## 2020-07-02 ENCOUNTER — Encounter: Payer: Self-pay | Admitting: Pulmonary Disease

## 2020-07-02 ENCOUNTER — Ambulatory Visit (INDEPENDENT_AMBULATORY_CARE_PROVIDER_SITE_OTHER): Payer: BC Managed Care – PPO | Admitting: Pulmonary Disease

## 2020-07-02 VITALS — BP 104/66 | HR 86 | Temp 97.7°F | Ht 74.0 in | Wt 245.6 lb

## 2020-07-02 DIAGNOSIS — U099 Post covid-19 condition, unspecified: Secondary | ICD-10-CM | POA: Diagnosis not present

## 2020-07-02 DIAGNOSIS — R0609 Other forms of dyspnea: Secondary | ICD-10-CM | POA: Diagnosis not present

## 2020-07-02 DIAGNOSIS — R0981 Nasal congestion: Secondary | ICD-10-CM | POA: Diagnosis not present

## 2020-07-02 NOTE — Patient Instructions (Addendum)
We will schedule you for pulmonary function tests to check on the shortness of breath Follow up in 6 months Use breath right strips for the sinus congestion

## 2020-07-02 NOTE — Progress Notes (Signed)
Synopsis: Referred by Lazaro Arms, NP  for shortness of breath  Subjective:   PATIENT ID: Henry [name] DOB: [birth year], MRN: [id number]   HPI  Chief Complaint  Patient presents with  . Consult    post covid 06/05/20, 1 week post out of breath when talking or walking , can't deep breath or take a full breath. only standing does he need like he can get a breath, when laying down it is worse   Henry [name] is a 49 year old [name], never smoker who is referred to pulmonary clinic for shortness of breath.   He was diagnosed with Covid on 06/01/20 and reports having shortness of breath since. He was unvaccinated. Prior to covid he was active doing 5-10 miles per day on the elliptical machine. He reports exertional shortness of breath as well as shortness of breath at rest. He has had multiple ED visits since his diagnosis for shortness of breath. CTA chest was performed on 06/17/20 which did not show pulmonary emboli but demonstrated patchy bilateral nodular airspace opacities. He then had chest radiographs on 06/23/20 and 06/25/20 which did not demonstrate opacities. An ABG on 06/23/20 showed pH 7.756, PCO 12.8, PO2 122.  He has been taking budesonide nebulizer treatments twice daily and albuterol as needed. He was also recently prescribed symbicort but has not started this inhaler yet. He says he does find some relief from the nebulizer treatments. He has also be taking hydroxyzine for anxiety and trying to incorporate breathing exercises for relaxation.      He complains of severe nasal congestion since covid which has been making it difficult for him to sleep. He reports using rhinocort along with sinus rinses without much improvement. He reports seasonal allergies, worse in the Spring and Fall.     Past Medical History:  Diagnosis Date  . Adjustment disorder with mixed emotional features   . Atypical chest pain 02/2019  . Insomnia   . Migraine syndrome   . Peyronie's disease 01/2013    Urology: Vit E 400 U/day  . Pneumonia   . Retinal artery occlusion 01/2012   Cardiac event monitor neg; carotid dopplers and transthoracic echo normal.  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 Parkinsonism Mother 10  . Arthritis Father      Social History   Socioeconomic History  . Marital status: Married    Spouse name: Not on file  . Number of children: Not on file  . Years of education: Not on file  . Highest education level: Not on file  Occupational History  . Not on file  Tobacco Use  . Smoking status: Never Smoker  . Smokeless tobacco: Never Used  Substance and Sexual Activity  . Alcohol use: Yes    Alcohol/week: 1.0 - 2.0 standard drink    Types: 1 - 2 Cans of beer per week  . Drug use: No  . Sexual activity: Yes  Other Topics Concern  . Not on file  Social History Narrative   Married, 5 children.   Orig from [place], went to college in [place].  Has one sister-healthy.   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   Exercises 5 days a week (runs/lifts wts).     No T/A/Ds.   Social Determinants of Health   Financial Resource Strain:   . Difficulty of Paying  Living Expenses: Not on file  Food Insecurity:   . Worried About Charity fundraiser in the Last Year: Not on file  . Ran Out of Food in the Last Year: Not on file  Transportation Needs:   . Lack of Transportation (Medical): Not on file  . Lack of Transportation (Non-Medical): Not on file  Physical Activity:   . Days of Exercise per Week: Not on file  . Minutes of Exercise per Session: Not on file  Stress:   . Feeling of Stress : Not on file  Social Connections:   . Frequency of Communication with Friends and Family: Not on file  . Frequency of Social Gatherings with Friends and Family: Not on file  . Attends Religious Services: Not on file  . Active Member of Clubs or Organizations: Not on  file  . Attends Archivist Meetings: Not on file  . Marital Status: Not on file  Intimate Partner Violence:   . Fear of Current or Ex-Partner: Not on file  . Emotionally Abused: Not on file  . Physically Abused: Not on file  . Sexually Abused: Not on file     No Known Allergies   Outpatient Medications Prior to Visit  Medication Sig Dispense Refill  . budesonide-formoterol (SYMBICORT) 80-4.5 MCG/ACT inhaler Inhale 2 puffs into the lungs 2 (two) times daily. 1 each 3  . Cholecalciferol (VITAMIN D3 ADULT GUMMIES PO) Take by mouth.    . CVS SALINE NASAL SPRAY 0.65 % nasal spray SMARTSIG:Spray(s) Both Nares    . CVS VITAMIN C 1000 MG tablet Take 2,000 mg by mouth daily.    . fluticasone (FLONASE) 50 MCG/ACT nasal spray Place into the nose.    . hydrOXYzine (ATARAX/VISTARIL) 25 MG tablet Take by mouth.    . omega-3 acid ethyl esters (LOVAZA) 1 g capsule Take by mouth 2 (two) times daily.    . Quercetin 250 MG TABS Take 500 mg by mouth daily.    . traMADol (ULTRAM) 50 MG tablet 1-2 tabs po qd as needed for a persistent headache 15 tablet 0  . VITAMIN K PO Take 1 tablet by mouth daily.    Marland Kitchen zinc gluconate 50 MG tablet Take 50 mg by mouth daily.    Marland Kitchen albuterol (VENTOLIN HFA) 108 (90 Base) MCG/ACT inhaler Inhale 2 puffs into the lungs every 4 (four) hours as needed. (Patient not taking: Reported on 07/02/2020)    . budesonide (PULMICORT) 1 MG/2ML nebulizer solution Take 2 mLs (1 mg total) by nebulization daily. (Patient not taking: Reported on 07/02/2020) 60 mL 0  . ADVAIR DISKUS 250-50 MCG/DOSE AEPB Inhale 1 puff into the lungs 2 (two) times daily.    Marland Kitchen albuterol (ACCUNEB) 1.25 MG/3ML nebulizer solution Take by nebulization.    Marland Kitchen ALPRAZolam (XANAX) 0.5 MG tablet 1-2 tabs po tid prn (Patient not taking: Reported on 04/29/2019) 60 tablet 0  . b complex vitamins capsule Take 1 capsule by mouth daily.    . benzonatate (TESSALON) 100 MG capsule Take 100 mg by mouth 3 (three) times daily.      . CVS D3 50 MCG (2000 UT) CAPS Take 1 capsule by mouth daily.    Marland Kitchen ELDERBERRY PO Take by mouth. Take 1 gummy daily.    . Multiple Vitamin (MULTIVITAMIN WITH MINERALS) TABS Take 1 tablet by mouth daily.    . predniSONE (DELTASONE) 5 MG tablet Take by mouth.    . propranolol (INDERAL) 40 MG tablet 1 tab po tid prn  anxiety (Patient not taking: Reported on 04/29/2019) 90 tablet 1  . triamcinolone cream (KENALOG) 0.1 % Apply topically daily.    . vitamin E 400 UNIT capsule Take 400 Units by mouth daily.     No facility-administered medications prior to visit.    Review of Systems  Constitutional: Negative for chills, diaphoresis, fever, malaise/fatigue and weight loss.  HENT: Positive for congestion. Negative for ear pain, nosebleeds, sinus pain and sore throat.   Eyes: Negative for blurred vision.  Respiratory: Positive for shortness of breath. Negative for cough, hemoptysis, sputum production and wheezing.   Cardiovascular: Negative for chest pain, palpitations, orthopnea, claudication, leg swelling and PND.  Gastrointestinal: Negative for abdominal pain, blood in stool, constipation, diarrhea, heartburn, melena, nausea and vomiting.  Genitourinary: Negative for hematuria.  Musculoskeletal: Negative for joint pain and myalgias.  Skin: Negative for itching and rash.  Neurological: Negative for dizziness and weakness.  Endo/Heme/Allergies: Does not bruise/bleed easily.  Psychiatric/Behavioral: The patient is nervous/anxious.     Objective:   Vitals:   07/02/20 1031  BP: 104/66  Pulse: 86  Temp: 97.7 F (36.5 C)  TempSrc: Temporal  SpO2: 96%  Weight: 245 lb 9.6 oz (111.4 kg)  Height: 6' 2"  (1.88 m)     Physical Exam Constitutional:      Appearance: Normal appearance. He is normal weight.  HENT:     Head: Normocephalic and atraumatic.     Nose:     Comments: Erythematous nasal passages    Mouth/Throat:     Mouth: Mucous membranes are moist.     Pharynx: Oropharynx is  clear.  Eyes:     General: No scleral icterus.    Extraocular Movements: Extraocular movements intact.     Conjunctiva/sclera: Conjunctivae normal.     Pupils: Pupils are equal, round, and reactive to light.  Cardiovascular:     Rate and Rhythm: Normal rate and regular rhythm.     Pulses: Normal pulses.     Heart sounds: Normal heart sounds. No murmur heard.   Pulmonary:     Effort: Pulmonary effort is normal. No respiratory distress.     Breath sounds: Normal breath sounds. No wheezing or rhonchi.  Abdominal:     General: Bowel sounds are normal. There is no distension.     Palpations: Abdomen is soft.  Musculoskeletal:     Right lower leg: No edema.     Left lower leg: No edema.  Neurological:     General: No focal deficit present.     Mental Status: He is alert and oriented to person, place, and time. Mental status is at baseline.  Psychiatric:        Mood and Affect: Mood normal.        Behavior: Behavior normal.        Thought Content: Thought content normal.        Judgment: Judgment normal.     CBC    Component Value Date/Time   WBC 8.1 03/23/2019 1428   RBC 5.54 03/23/2019 1428   HGB 16.2 03/23/2019 1428   HCT 47.9 03/23/2019 1428   PLT 236.0 03/23/2019 1428   MCV 86.5 03/23/2019 1428   MCH 29.6 01/29/2012 0848   MCHC 33.8 03/23/2019 1428   RDW 13.7 03/23/2019 1428   LYMPHSABS 1.7 03/23/2019 1428   MONOABS 0.4 03/23/2019 1428   EOSABS 0.1 03/23/2019 1428   BASOSABS 0.1 03/23/2019 1428     Chest imaging: CTA Chest 06/17/20 Cardiovascular: Heart is normal size. Thoracic  aorta is normal. Takeoff of the great vessels is unremarkable. Pulmonary arterial system is well opacified without evidence of emboli. Remaining vascular structures are unremarkable.  Mediastinum/Nodes: No significant mediastinal or hilar adenopathy. Remaining mediastinal structures are normal.  Lungs/Pleura: Lungs are adequately inflated and demonstrate patchy bilateral nodular  airspace process likely multifocal pneumonia. No effusion. Airways are normal.  Upper Abdomen: No acute findings. Previous cholecystectomy.  Musculoskeletal: Mild loss of anterior vertebral body height of 2 adjacent midthoracic vertebral bodies.  06/25/20 CXR Cardiovascular: Cardiac silhouette and pulmonary vasculature are within normal limits.  Mediastinum: Within normal limits.  Lungs/pleura: No focal pulmonary opacities. No pleural effusion. No pneumothorax.  Upper abdomen: Visualizedportions are unremarkable.  Chest wall/osseous structures: Unremarkable.   Labs: 06/25/20 BMP: Na 138, K 3.8, Cl 103, CO2 26, BUN 16 Cr 0.85, Ca 9.7 CBC: WBC 10.2, Hgb 14.7, Hct 43.7, Plt 257 An ABG on 06/23/20 showed pH 7.756, PCO 12.8, PO2 122.     Assessment & Plan:   Post-COVID chronic dyspnea - Plan: Pulmonary Function Test  Sinus congestion  Discussion: Henry [name] is a 49 year old [name], non-smoker with persistent shortness of breath after recent covid infection in September.   We will obtain pulmonary function tests to further evaluate his dyspnea. He can continue on budesonide nebulizer treatments twice daily or start using the Symbicort inhaler that has been prescribed to him.   For the sinus congestion he is to continue on inhaled nasal steroid and saline sinus rinses. Instructed him to add breath right nasal strip at night to help with nocturnal breathing. If he continues to experience sinus issues, would recommend referral to ENT for further evaluation.   He does have a component of anxiety to his dyspnea as displayed by the ABG on 06/23/20 which demonstrates hyperventilation.  Patient to follow up in 6 months  Freda Jackson, MD [facility] Office: [phone]     Current Outpatient Medications:  .  budesonide-formoterol (SYMBICORT) 80-4.5 MCG/ACT inhaler, Inhale 2 puffs into the lungs 2 (two) times daily., Disp: 1 each, Rfl: 3 .  Cholecalciferol (VITAMIN  D3 ADULT GUMMIES PO), Take by mouth., Disp: , Rfl:  .  CVS SALINE NASAL SPRAY 0.65 % nasal spray, SMARTSIG:Spray(s) Both Nares, Disp: , Rfl:  .  CVS VITAMIN C 1000 MG tablet, Take 2,000 mg by mouth daily., Disp: , Rfl:  .  fluticasone (FLONASE) 50 MCG/ACT nasal spray, Place into the nose., Disp: , Rfl:  .  hydrOXYzine (ATARAX/VISTARIL) 25 MG tablet, Take by mouth., Disp: , Rfl:  .  omega-3 acid ethyl esters (LOVAZA) 1 g capsule, Take by mouth 2 (two) times daily., Disp: , Rfl:  .  Quercetin 250 MG TABS, Take 500 mg by mouth daily., Disp: , Rfl:  .  traMADol (ULTRAM) 50 MG tablet, 1-2 tabs po qd as needed for a persistent headache, Disp: 15 tablet, Rfl: 0 .  VITAMIN K PO, Take 1 tablet by mouth daily., Disp: , Rfl:  .  zinc gluconate 50 MG tablet, Take 50 mg by mouth daily., Disp: , Rfl:  .  albuterol (VENTOLIN HFA) 108 (90 Base) MCG/ACT inhaler, Inhale 2 puffs into the lungs every 4 (four) hours as needed. (Patient not taking: Reported on 07/02/2020), Disp: , Rfl:  .  budesonide (PULMICORT) 1 MG/2ML nebulizer solution, Take 2 mLs (1 mg total) by nebulization daily. (Patient not taking: Reported on 07/02/2020), Disp: 60 mL, Rfl: 0

## 2020-07-05 NOTE — Telephone Encounter (Signed)
Patient was seen at [facility] and [facility] for SOB

## 2020-07-20 ENCOUNTER — Encounter: Payer: Self-pay | Admitting: Internal Medicine

## 2020-07-20 ENCOUNTER — Ambulatory Visit: Payer: BC Managed Care – PPO | Admitting: Internal Medicine

## 2020-07-20 ENCOUNTER — Other Ambulatory Visit: Payer: Self-pay

## 2020-07-20 VITALS — BP 110/80 | HR 61 | Ht 74.0 in | Wt 242.0 lb

## 2020-07-20 DIAGNOSIS — R079 Chest pain, unspecified: Secondary | ICD-10-CM | POA: Diagnosis not present

## 2020-07-20 DIAGNOSIS — Z7189 Other specified counseling: Secondary | ICD-10-CM

## 2020-07-20 NOTE — Patient Instructions (Signed)
Medication Instructions:  No changes *If you need a refill on your cardiac medications before your next appointment, please call your pharmacy*   Lab Work: none If you have labs (blood work) drawn today and your tests are completely normal, you will receive your results only by: Marland Kitchen MyChart Message (if you have MyChart) OR . A paper copy in the mail If you have any lab test that is abnormal or we need to change your treatment, we will call you to review the results.   Testing/Procedures: Your physician has requested that you have an echocardiogram. Echocardiography is a painless test that uses sound waves to create images of your heart. It provides your doctor with information about the size and shape of your heart and how well your heart's chambers and valves are working. This procedure takes approximately one hour. There are no restrictions for this procedure.   Follow-Up: At [facility], you and your health needs are our priority.  As part of our continuing mission to provide you with exceptional heart care, we have created designated Provider Care Teams.  These Care Teams include your primary Cardiologist (physician) and Advanced Practice Providers (APPs -  Physician Assistants and Nurse Practitioners) who all work together to provide you with the care you need, when you need it.    Your next appointment:   3 month(s)  The format for your next appointment:   In Person  Provider:   Rudean Haskell, MD   Other Instructions
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
## 2020-07-27 ENCOUNTER — Ambulatory Visit: Payer: BC Managed Care – PPO

## 2020-08-03 ENCOUNTER — Encounter: Payer: Self-pay | Admitting: Family Medicine

## 2020-08-03 ENCOUNTER — Ambulatory Visit: Payer: BC Managed Care – PPO | Admitting: Family Medicine

## 2020-08-03 ENCOUNTER — Other Ambulatory Visit: Payer: Self-pay

## 2020-08-03 VITALS — BP 105/68 | HR 64 | Temp 97.6°F | Resp 16 | Ht 74.0 in | Wt 242.8 lb

## 2020-08-03 DIAGNOSIS — Z23 Encounter for immunization: Secondary | ICD-10-CM | POA: Diagnosis not present

## 2020-08-03 DIAGNOSIS — Z0000 Encounter for general adult medical examination without abnormal findings: Secondary | ICD-10-CM

## 2020-08-03 DIAGNOSIS — Z8616 Personal history of COVID-19: Secondary | ICD-10-CM

## 2020-08-03 LAB — CBC WITH DIFFERENTIAL/PLATELET
Basophils Absolute: 0 10*3/uL (ref 0.0–0.1)
Basophils Relative: 1 % (ref 0.0–3.0)
Eosinophils Absolute: 0.1 10*3/uL (ref 0.0–0.7)
Eosinophils Relative: 1.3 % (ref 0.0–5.0)
HCT: 43.2 % (ref 39.0–52.0)
Hemoglobin: 14.7 g/dL (ref 13.0–17.0)
Lymphocytes Relative: 36.6 % (ref 12.0–46.0)
Lymphs Abs: 1.9 10*3/uL (ref 0.7–4.0)
MCHC: 34 g/dL (ref 30.0–36.0)
MCV: 86.9 fl (ref 78.0–100.0)
Monocytes Absolute: 0.4 10*3/uL (ref 0.1–1.0)
Monocytes Relative: 7.8 % (ref 3.0–12.0)
Neutro Abs: 2.7 10*3/uL (ref 1.4–7.7)
Neutrophils Relative %: 53.3 % (ref 43.0–77.0)
Platelets: 221 10*3/uL (ref 150.0–400.0)
RBC: 4.97 Mil/uL (ref 4.22–5.81)
RDW: 14 % (ref 11.5–15.5)
WBC: 5.1 10*3/uL (ref 4.0–10.5)

## 2020-08-03 LAB — COMPREHENSIVE METABOLIC PANEL
ALT: 26 U/L (ref 0–53)
AST: 19 U/L (ref 0–37)
Albumin: 4.5 g/dL (ref 3.5–5.2)
Alkaline Phosphatase: 54 U/L (ref 39–117)
BUN: 15 mg/dL (ref 6–23)
CO2: 26 mEq/L (ref 19–32)
Calcium: 9.9 mg/dL (ref 8.4–10.5)
Chloride: 106 mEq/L (ref 96–112)
Creatinine, Ser: 1 mg/dL (ref 0.40–1.50)
GFR: 88.75 mL/min (ref >60.00)
Glucose, Bld: 88 mg/dL (ref 70–99)
Potassium: 4.1 mEq/L (ref 3.5–5.1)
Sodium: 141 mEq/L (ref 135–145)
Total Bilirubin: 1.4 mg/dL — ABNORMAL HIGH (ref 0.2–1.2)
Total Protein: 6.5 g/dL (ref 6.0–8.3)

## 2020-08-03 NOTE — Patient Instructions (Signed)
OK to get covid 19 vaccine at your earliest convenience

## 2020-08-03 NOTE — Addendum Note (Signed)
Addended by: Deveron Furlong D on: 08/03/2020 09:08 AM   Modules accepted: Orders
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
## 2020-08-06 ENCOUNTER — Encounter: Payer: Self-pay | Admitting: Family Medicine

## 2020-08-06 NOTE — Telephone Encounter (Signed)
Patient saw results via mychart but had additional questions. Please advise, thanks.

## 2020-08-10 ENCOUNTER — Ambulatory Visit (HOSPITAL_COMMUNITY): Payer: BC Managed Care – PPO | Attending: Internal Medicine

## 2020-08-10 ENCOUNTER — Other Ambulatory Visit: Payer: Self-pay

## 2020-08-10 DIAGNOSIS — Z7189 Other specified counseling: Secondary | ICD-10-CM | POA: Diagnosis not present

## 2020-08-10 DIAGNOSIS — R079 Chest pain, unspecified: Secondary | ICD-10-CM | POA: Insufficient documentation

## 2020-08-10 HISTORY — PX: TRANSTHORACIC ECHOCARDIOGRAM: SHX275

## 2020-08-10 LAB — ECHOCARDIOGRAM COMPLETE
Area-P 1/2: 3.65 cm2
S' Lateral: 3.1 cm

## 2020-09-10 ENCOUNTER — Encounter: Payer: Self-pay | Admitting: Family Medicine

## 2020-09-10 MED ORDER — BUDESONIDE-FORMOTEROL FUMARATE 80-4.5 MCG/ACT IN AERO
2.0000 | INHALATION_SPRAY | Freq: Two times a day (BID) | RESPIRATORY_TRACT | 1 refills | Status: DC
Start: 2020-09-10 — End: 2021-03-01

## 2020-09-10 NOTE — Telephone Encounter (Signed)
OK, symbicort [facility].

## 2020-09-10 NOTE — Telephone Encounter (Signed)
Please advise of correct dosage for Rx to be sent to mail in pharmacy.

## 2020-09-29 DIAGNOSIS — S8263XA Displaced fracture of lateral malleolus of unspecified fibula, initial encounter for closed fracture: Secondary | ICD-10-CM

## 2020-09-29 HISTORY — DX: Displaced fracture of lateral malleolus of unspecified fibula, initial encounter for closed fracture: S82.63XA

## 2020-10-12 DIAGNOSIS — M25571 Pain in right ankle and joints of right foot: Secondary | ICD-10-CM | POA: Diagnosis not present

## 2020-10-18 ENCOUNTER — Encounter: Payer: Self-pay | Admitting: Family Medicine

## 2020-10-22 ENCOUNTER — Ambulatory Visit: Payer: BC Managed Care – PPO | Admitting: Internal Medicine

## 2020-11-13 ENCOUNTER — Encounter: Payer: Self-pay | Admitting: Internal Medicine

## 2020-11-13 ENCOUNTER — Ambulatory Visit: Payer: BC Managed Care – PPO | Admitting: Internal Medicine

## 2020-11-13 ENCOUNTER — Other Ambulatory Visit: Payer: Self-pay

## 2020-11-13 VITALS — BP 124/84 | HR 74 | Ht 74.0 in | Wt 246.2 lb

## 2020-11-13 DIAGNOSIS — R002 Palpitations: Secondary | ICD-10-CM

## 2020-11-13 DIAGNOSIS — R079 Chest pain, unspecified: Secondary | ICD-10-CM | POA: Diagnosis not present

## 2020-11-13 DIAGNOSIS — Z8616 Personal history of COVID-19: Secondary | ICD-10-CM
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
## 2020-11-13 NOTE — Patient Instructions (Addendum)
Medication Instructions:  Your physician recommends that you continue on your current medications as directed. Please refer to the Current Medication list given to you today.  *If you need a refill on your cardiac medications before your next appointment, please call your pharmacy*   Lab Work: NONE If you have labs (blood work) drawn today and your tests are completely normal, you will receive your results only by: Marland Kitchen MyChart Message (if you have MyChart) OR . A paper copy in the mail If you have any lab test that is abnormal or we need to change your treatment, we will call you to review the results.   Testing/Procedures: NONE   Follow-Up:  Follow up as needed At [facility], you and your health needs are our priority.  As part of our continuing mission to provide you with exceptional heart care, we have created designated Provider Care Teams.  These Care Teams include your primary Cardiologist (physician) and Advanced Practice Providers (APPs -  Physician Assistants and Nurse Practitioners) who all work together to provide you with the care you need, when you need it.   Provider:   You may see Werner Lean, MD or one of the following Advanced Practice Providers on your designated Care Team:    Melina Copa, PA-C  Ermalinda Barrios, PA-C  Patient request AVS through MyChart only.

## 2021-02-26 ENCOUNTER — Telehealth: Payer: Self-pay | Admitting: Internal Medicine

## 2021-02-26 ENCOUNTER — Other Ambulatory Visit: Payer: Self-pay

## 2021-02-26 NOTE — Telephone Encounter (Signed)
paitent states for the past week he has beenextremely fatigued.  Patient c/o Palpitations:  High priority if patient c/o lightheadedness, shortness of breath, or chest pain  1) How long have you had palpitations/irregular HR/ Afib? Are you having the symptoms now?  Patient states he has been having palpitations on and off for the past week.  No symptoms currently, but he states he has also been extremely fatigued.  2) Are you currently experiencing lightheadedness, SOB or CP?  No   3) Do you have a history of afib (atrial fibrillation) or irregular heart rhythm?  No   4) Have you checked your BP or HR? (document readings if available):  He states he is monitoring his HR on his smart watch and it has been ranging in the 60's at rest. He states the highest it has gotten to is about 100 BPM.  5) Are you experiencing any other symptoms? Extreme fatigue

## 2021-02-26 NOTE — Telephone Encounter (Signed)
Called patient in regards to palpitations.  He reports that he noticed palpitations on Thursday while in a meeting.  He broke out in a cold sweat and could feel his heart racing.  He reports that palpitations come and go along with tingling/ cold feeling to all extremities.  He also expressed that his  left side is tender to touch, feels he may have pulled a muscle.  Per patient he drinks gatorade and  6-8 glasses of water a day.  He can function as normal but can feel palpitations.  He requested to have an ov with Dr. Gasper Sells.  Appointment made for 02/27/21 at 8:20am.

## 2021-02-27 ENCOUNTER — Encounter: Payer: Self-pay | Admitting: Internal Medicine

## 2021-02-27 ENCOUNTER — Ambulatory Visit: Payer: BC Managed Care – PPO | Admitting: Internal Medicine

## 2021-02-27 ENCOUNTER — Other Ambulatory Visit: Payer: Self-pay

## 2021-02-27 ENCOUNTER — Ambulatory Visit (INDEPENDENT_AMBULATORY_CARE_PROVIDER_SITE_OTHER): Payer: BC Managed Care – PPO

## 2021-02-27 VITALS — BP 122/80 | HR 84 | Ht 74.0 in | Wt 243.8 lb

## 2021-02-27 DIAGNOSIS — Z8669 Personal history of other diseases of the nervous system and sense organs: Secondary | ICD-10-CM | POA: Diagnosis not present

## 2021-02-27 DIAGNOSIS — F419 Anxiety disorder, unspecified: Secondary | ICD-10-CM | POA: Diagnosis not present

## 2021-02-27 DIAGNOSIS — R002 Palpitations: Secondary | ICD-10-CM | POA: Diagnosis not present

## 2021-02-27 DIAGNOSIS — Z8616 Personal history of COVID-19: Secondary | ICD-10-CM

## 2021-02-27 HISTORY — PX: OTHER SURGICAL HISTORY: SHX169

## 2021-02-27 NOTE — Patient Instructions (Signed)
Medication Instructions:  Your physician recommends that you continue on your current medications as directed. Please refer to the Current Medication list given to you today.  *If you need a refill on your cardiac medications before your next appointment, please call your pharmacy*   Lab Work: NONE If you have labs (blood work) drawn today and your tests are completely normal, you will receive your results only by: Marland Kitchen MyChart Message (if you have MyChart) OR . A paper copy in the mail If you have any lab test that is abnormal or we need to change your treatment, we will call you to review the results.   Testing/Procedures: Your physician has requested that you wear a heart monitor.    Follow-Up: At [facility], you and your health needs are our priority.  As part of our continuing mission to provide you with exceptional heart care, we have created designated Provider Care Teams.  These Care Teams include your primary Cardiologist (physician) and Advanced Practice Providers (APPs -  Physician Assistants and Nurse Practitioners) who all work together to provide you with the care you need, when you need it.  We recommend signing up for the patient portal called "MyChart".  Sign up information is provided on this After Visit Summary.  MyChart is used to connect with patients for Virtual Visits (Telemedicine).  Patients are able to view lab/test results, encounter notes, upcoming appointments, etc.  Non-urgent messages can be sent to your provider as well.   To learn more about what you can do with MyChart, go to [email or web address].    Your next appointment:   6 month(s)  The format for your next appointment:   In Person  Provider:   You may see Werner Lean, MD or one of the following Advanced Practice Providers on your designated Care Team:    Melina Copa, PA-C  Ermalinda Barrios, PA-C    Other Instructions  Bryn Gulling- Long Term Monitor Instructions  Your physician  has requested you wear a ZIO patch monitor for 7 days.  This is a single patch monitor. [employer] supplies one patch monitor per enrollment. Additional stickers are not available. Please do not apply patch if you will be having a Nuclear Stress Test,  Echocardiogram, Cardiac CT, MRI, or Chest Xray during the period you would be wearing the  monitor. The patch cannot be worn during these tests. You cannot remove and re-apply the  ZIO XT patch monitor.  Your ZIO patch monitor will be mailed 3 day USPS to your address on file. It may take 3-5 days  to receive your monitor after you have been enrolled.  Once you have received your monitor, please review the enclosed instructions. Your monitor  has already been registered assigning a specific monitor serial # to you.  Billing and Patient Assistance Program Information  We have supplied [employer] with any of your insurance information on file for billing purposes. [employer] offers a sliding scale Patient Assistance Program for patients that do not have  insurance, or whose insurance does not completely cover the cost of the ZIO monitor.  You must apply for the Patient Assistance Program to qualify for this discounted rate.  To apply, please call [employer] at [phone], select option 4, select option 2, ask to apply for  Patient Assistance Program. Theodore Demark will ask your household income, and how many people  are in your household. They will quote your out-of-pocket cost based on that information.  [employer] will also be able to set  up a 6-month interest-free payment plan if needed.  Applying the monitor   Shave hair from upper left chest.  Hold abrader disc by orange tab. Rub abrader in 40 strokes over the upper left chest as  indicated in your monitor instructions.  Clean area with 4 enclosed alcohol pads. Let dry.  Apply patch as indicated in monitor instructions. Patch will be placed under collarbone on left  side of chest with arrow pointing  upward.  Rub patch adhesive wings for 2 minutes. Remove white label marked "1". Remove the white  label marked "2". Rub patch adhesive wings for 2 additional minutes.  While looking in a mirror, press and release button in center of patch. A small green light will  flash 3-4 times. This will be your only indicator that the monitor has been turned on.  Do not shower for the first 24 hours. You may shower after the first 24 hours.  Press the button if you feel a symptom. You will hear a small click. Record Date, Time and  Symptom in the Patient Logbook.  When you are ready to remove the patch, follow instructions on the last 2 pages of Patient  Logbook. Stick patch monitor onto the last page of Patient Logbook.  Place Patient Logbook in the blue and white box. Use locking tab on box and tape box closed  securely. The blue and white box has prepaid postage on it. Please place it in the mailbox as  soon as possible. Your physician should have your test results approximately 7 days after the  monitor has been [facility]  Call [place]at [phone] you have questions regarding  your ZIO XT patch monitor. Call them immediately if you see an orange light blinking on your  monitor.  If your monitor falls off in less than 4 days, contact our Monitor department at [phone]  If your monitor becomes loose or falls off after 4 days call [employer] at [phone]  suggestions on securing your monitor
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
## 2021-02-27 NOTE — Progress Notes (Unsigned)
Patient enrolled for [employer] to mail a 7 day ZIO XT monitor to his home.

## 2021-03-01 ENCOUNTER — Encounter: Payer: Self-pay | Admitting: Family Medicine

## 2021-03-01 ENCOUNTER — Other Ambulatory Visit: Payer: Self-pay

## 2021-03-01 ENCOUNTER — Ambulatory Visit: Payer: BC Managed Care – PPO | Admitting: Family Medicine

## 2021-03-01 VITALS — BP 115/79 | HR 76 | Temp 97.6°F | Wt 244.2 lb

## 2021-03-01 DIAGNOSIS — R002 Palpitations: Secondary | ICD-10-CM

## 2021-03-01 DIAGNOSIS — Z13220 Encounter for screening for lipoid disorders: Secondary | ICD-10-CM

## 2021-03-01 DIAGNOSIS — F419 Anxiety disorder, unspecified: Secondary | ICD-10-CM

## 2021-03-01 DIAGNOSIS — Z8669 Personal history of other diseases of the nervous system and sense organs: Secondary | ICD-10-CM | POA: Diagnosis not present

## 2021-03-01 DIAGNOSIS — R5383 Other fatigue: Secondary | ICD-10-CM

## 2021-03-01 DIAGNOSIS — R001 Bradycardia, unspecified: Secondary | ICD-10-CM

## 2021-03-01 LAB — CBC WITH DIFFERENTIAL/PLATELET
Basophils Absolute: 0 10*3/uL (ref 0.0–0.1)
Basophils Relative: 0.8 % (ref 0.0–3.0)
Eosinophils Absolute: 0.1 10*3/uL (ref 0.0–0.7)
Eosinophils Relative: 1.3 % (ref 0.0–5.0)
HCT: 46.1 % (ref 39.0–52.0)
Hemoglobin: 15.9 g/dL (ref 13.0–17.0)
Lymphocytes Relative: 30.3 % (ref 12.0–46.0)
Lymphs Abs: 1.7 10*3/uL (ref 0.7–4.0)
MCHC: 34.5 g/dL (ref 30.0–36.0)
MCV: 86.3 fl (ref 78.0–100.0)
Monocytes Absolute: 0.4 10*3/uL (ref 0.1–1.0)
Monocytes Relative: 7.3 % (ref 3.0–12.0)
Neutro Abs: 3.4 10*3/uL (ref 1.4–7.7)
Neutrophils Relative %: 60.3 % (ref 43.0–77.0)
Platelets: 217 10*3/uL (ref 150.0–400.0)
RBC: 5.34 Mil/uL (ref 4.22–5.81)
RDW: 13.1 % (ref 11.5–15.5)
WBC: 5.7 10*3/uL (ref 4.0–10.5)

## 2021-03-01 LAB — CORTISOL: Cortisol, Plasma: 13.3 ug/dL

## 2021-03-01 LAB — TSH: TSH: 1.12 u[IU]/mL (ref 0.35–4.50)

## 2021-03-01 MED ORDER — PANTOPRAZOLE SODIUM 40 MG PO TBEC: DELAYED_RELEASE_TABLET | ORAL | 0 refills | Status: DC
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
## 2021-03-03 DIAGNOSIS — R002 Palpitations: Secondary | ICD-10-CM | POA: Diagnosis not present

## 2021-03-04 LAB — COMPREHENSIVE METABOLIC PANEL
ALT: 32 U/L (ref 0–53)
AST: 21 U/L (ref 0–37)
Albumin: 4.6 g/dL (ref 3.5–5.2)
Alkaline Phosphatase: 54 U/L (ref 39–117)
BUN: 16 mg/dL (ref 6–23)
CO2: 24 mEq/L (ref 19–32)
Calcium: 9.8 mg/dL (ref 8.4–10.5)
Chloride: 103 mEq/L (ref 96–112)
Creatinine, Ser: 0.99 mg/dL (ref 0.40–1.50)
GFR: 89.46 mL/min (ref >60.00)
Glucose, Bld: 91 mg/dL (ref 70–99)
Potassium: 4.5 mEq/L (ref 3.5–5.1)
Sodium: 141 mEq/L (ref 135–145)
Total Bilirubin: 1.2 mg/dL (ref 0.2–1.2)
Total Protein: 6.9 g/dL (ref 6.0–8.3)

## 2021-03-04 LAB — LIPID PANEL
Cholesterol: 187 mg/dL (ref 0–200)
HDL: 57 mg/dL (ref >39.00)
LDL Cholesterol: 117 mg/dL — ABNORMAL HIGH (ref 0–99)
NonHDL: 130.38
Total CHOL/HDL Ratio: 3
Triglycerides: 67 mg/dL (ref 0.0–149.0)
VLDL: 13.4 mg/dL (ref 0.0–40.0)

## 2021-03-05 ENCOUNTER — Encounter: Payer: Self-pay | Admitting: Family Medicine

## 2021-03-05 MED ORDER — LORAZEPAM 1 MG PO TABS: ORAL_TABLET | ORAL | 0 refills | Status: DC

## 2021-03-05 NOTE — Telephone Encounter (Signed)
Hi [name], lets try a medication that can help decrease the tendency for severe anxiety plus should help you get to sleep (lorazepam).  I anticipate only short term need.  Getting a few nights of sleep will likely do you wonders. Take this med about 1 hour before trying to go to sleep. No need to take melatonin while taking this med. Let me know how it goes. --PM

## 2021-03-05 NOTE — Telephone Encounter (Signed)
Pt was last seen 03/01/21 for palpitations, recent labs.   Please Advise.

## 2021-03-06 ENCOUNTER — Encounter: Payer: Self-pay | Admitting: Family Medicine

## 2021-03-06 ENCOUNTER — Telehealth: Payer: Self-pay

## 2021-03-06 NOTE — Telephone Encounter (Signed)
LM for pt to return call regarding form that was completed brought to 6/3 appt for completion. Need to know if we are just faxing this or if he would like original back

## 2021-03-06 NOTE — Telephone Encounter (Signed)
Pt sent mychart message requesting form to be sent back via e-mail to him. Form will be scanned to e-mail.

## 2021-03-20 ENCOUNTER — Ambulatory Visit: Payer: BC Managed Care – PPO | Admitting: Family Medicine

## 2021-03-20 ENCOUNTER — Other Ambulatory Visit: Payer: Self-pay

## 2021-03-20 ENCOUNTER — Encounter: Payer: Self-pay | Admitting: Family Medicine

## 2021-03-20 VITALS — BP 112/73 | HR 95 | Temp 98.0°F | Wt 244.4 lb

## 2021-03-20 DIAGNOSIS — F5104 Psychophysiologic insomnia: Secondary | ICD-10-CM

## 2021-03-20 DIAGNOSIS — K219 Gastro-esophageal reflux disease without esophagitis: Secondary | ICD-10-CM | POA: Diagnosis not present

## 2021-03-20 DIAGNOSIS — Z1211 Encounter for screening for malignant neoplasm of colon: Secondary | ICD-10-CM | POA: Diagnosis not present

## 2021-03-20 DIAGNOSIS — F4322 Adjustment disorder with anxiety: Secondary | ICD-10-CM | POA: Diagnosis not present

## 2021-03-20 DIAGNOSIS — Z125 Encounter for screening for malignant neoplasm of prostate: Secondary | ICD-10-CM

## 2021-03-20 DIAGNOSIS — G4489 Other headache syndrome: Secondary | ICD-10-CM

## 2021-03-20 MED ORDER — CITALOPRAM HYDROBROMIDE 20 MG PO TABS: 20.0000 mg | ORAL_TABLET | Freq: Every day | ORAL | 1 refills | Status: DC

## 2021-03-20 MED ORDER — PANTOPRAZOLE SODIUM 40 MG PO TBEC: DELAYED_RELEASE_TABLET | ORAL | 3 refills | Status: DC

## 2021-03-20 MED ORDER — TRAMADOL HCL 50 MG PO TABS: ORAL_TABLET | ORAL | 0 refills | Status: DC

## 2021-03-20 MED ORDER — LORAZEPAM 1 MG PO TABS: ORAL_TABLET | ORAL | 0 refills | Status: DC
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
## 2021-03-21 LAB — PSA: PSA: 0.66 ng/mL (ref 0.10–4.00)

## 2021-03-25 ENCOUNTER — Encounter: Payer: Self-pay | Admitting: Family Medicine

## 2021-03-25 NOTE — Telephone Encounter (Signed)
Restart symbicort. If needs new rx pls to symbicort 160/4.5, 2 p bid, #1, RF x 3. Regarding his breathing concerns, I strongly recommend he make an appointment with the pulmonologist he saw last year after he had covid (Dr. Freda Jackson with [facility]). He should not need a new referral. I really don't know anything else to offer at this time.

## 2021-03-25 NOTE — Telephone Encounter (Signed)
Please Advise

## 2021-03-26 ENCOUNTER — Other Ambulatory Visit: Payer: Self-pay

## 2021-03-26 MED ORDER — BUDESONIDE-FORMOTEROL FUMARATE 160-4.5 MCG/ACT IN AERO: 2.0000 | INHALATION_SPRAY | Freq: Two times a day (BID) | RESPIRATORY_TRACT | 3 refills | Status: DC

## 2021-03-27 ENCOUNTER — Telehealth: Payer: Self-pay | Admitting: Pulmonary Disease

## 2021-03-27 DIAGNOSIS — U099 Post covid-19 condition, unspecified: Secondary | ICD-10-CM

## 2021-03-27 DIAGNOSIS — R0609 Other forms of dyspnea: Secondary | ICD-10-CM

## 2021-03-27 NOTE — Telephone Encounter (Signed)
Called and spoke with pt who states he started not feeling well 3 weeks ago. States that he developed a cold and was having some problems with congestion. Stated that symptoms first hit him while he was trying to sleep as he started having problems breathing and also stated that he felt like his heart as racing.  Pt said still today 3 weeks later, he is still having tightness in his chest and states that he is able to still exercise but after he does get done with exercising, he does not feel well at all.  Pt wonders if his symptoms could be asthma related. Also made a comment that how he has been feeling within the last 3 weeks is similar to how he felt when he had Covid back October 2021.  Pt said that he never took a covid test when he first developed the cold 3 weeks ago. Pt has not had any complaints of any fever and also has not had any cough, just increased SOB and chest tightness.  Due to what is currently going on, pt wants to know what can be recommended. Dr. Erin Fulling, please advise if you would be okay with [place] scheduling pt an in-office visit with you 7/5 or what you recommend.

## 2021-03-27 NOTE — Telephone Encounter (Signed)
Called and LM on VM to call back about symptoms.

## 2021-03-27 NOTE — Telephone Encounter (Signed)
Order placed for the cxr to be performed. Called and spoke with pt and have scheduled him for a f/u with Dr. Erin Fulling 7/5 and stated to him to arrive 5mn earlier for cxr. Nothing further needed.

## 2021-03-29 DIAGNOSIS — Z1211 Encounter for screening for malignant neoplasm of colon: Secondary | ICD-10-CM

## 2021-03-29 HISTORY — DX: Encounter for screening for malignant neoplasm of colon: Z12.11

## 2021-04-02 ENCOUNTER — Ambulatory Visit: Payer: BC Managed Care – PPO | Admitting: Pulmonary Disease

## 2021-04-03 DIAGNOSIS — Z1211 Encounter for screening for malignant neoplasm of colon: Secondary | ICD-10-CM | POA: Diagnosis not present

## 2021-04-03 LAB — COLOGUARD: Cologuard: NEGATIVE

## 2021-04-04 ENCOUNTER — Ambulatory Visit (INDEPENDENT_AMBULATORY_CARE_PROVIDER_SITE_OTHER): Payer: BC Managed Care – PPO

## 2021-04-04 ENCOUNTER — Other Ambulatory Visit: Payer: Self-pay

## 2021-04-04 ENCOUNTER — Ambulatory Visit: Payer: BC Managed Care – PPO | Admitting: Pulmonary Disease

## 2021-04-04 ENCOUNTER — Encounter: Payer: Self-pay | Admitting: Pulmonary Disease

## 2021-04-04 VITALS — BP 118/84 | HR 84 | Ht 77.0 in | Wt 248.6 lb

## 2021-04-04 DIAGNOSIS — R0609 Other forms of dyspnea: Secondary | ICD-10-CM | POA: Diagnosis not present

## 2021-04-04 DIAGNOSIS — U099 Post covid-19 condition, unspecified: Secondary | ICD-10-CM

## 2021-04-04 DIAGNOSIS — R0602 Shortness of breath: Secondary | ICD-10-CM

## 2021-04-04 DIAGNOSIS — J683 Other acute and subacute respiratory conditions due to chemicals, gases, fumes and vapors: Secondary | ICD-10-CM | POA: Diagnosis not present

## 2021-04-04 DIAGNOSIS — R0600 Dyspnea, unspecified: Secondary | ICD-10-CM | POA: Diagnosis not present

## 2021-04-04 NOTE — Patient Instructions (Addendum)
Continue symbicort 2 puffs twice daily  - rinse mouth out after each use  Use albuterol as needed 1-2 puffs every 4-6 hours for cough, wheezing, shortness of breath or chest tightness  Continue fluticasone 2 sprays per nostril daily  We will schedule you for pulmonary function tests in the next few days.

## 2021-04-04 NOTE — Progress Notes (Signed)
Synopsis: Referred by Lazaro Arms, NP  for shortness of breath  Subjective:   PATIENT ID: Henry [name] GENDER: male DOB: [birth year], MRN: [id number]  Over last month he's had increasing symptoms again. Had an incident while at work during a meeting, chills/cold sweats Previously had resolved completely and was back to exercising after our last visit.  Cardiac monitor negative. Lab work normal by PCP.  Trouble sleeping now. Tried lorazepam to help sleep,  Symbicort continued but was down to 1 puff a day. Now two puffs at night.  Sharp pain in center of chest. If he lies down he has shortness of breath. Sleeps on his sides now.  +wheezy when talking on the phone a lot. No issues when working outside doing yard work.   HPI  Chief Complaint  Patient presents with   Follow-up    F/U on increased SOB and sharp pain when taking a deep breath. This started about 2-3 weeks ago. Tends to notice the SOB and pain with exertion. Wants to know if he needs to keep taking Symbicort 160.   Henry [name] is a 50 year old male, never smoker who returns to pulmonary clinic for shortness of breath.   He reports since last visit his shortness of breath had completely resolved and he had returned back to working out and weight exercises without issues.  He then developed recurrent symptoms with mainly shortness of breath and intermittent wheezing over the past 3 to 4 weeks.  He reports a potential upper respiratory viral illness about a month ago.  After his breathing had improved he had reduced his Symbicort usage to 1 puff/day which he is currently using.  He denies any cough or sputum production.  He does complain of chest tightness and discomfort more centrally.  He does report trouble sleeping over the last month due to anxiety.  He has had follow-up with his cardiologist with work-up including cardiac monitoring with no arrhythmias noted.  Laboratory work-up through his primary care showed normal  chemistry panel and normal CBC with absolute eosinophil count of 100.  OV 07/02/20 He was diagnosed with Covid on 06/01/20 and reports having shortness of breath since. He was unvaccinated. Prior to covid he was active doing 5-10 miles per day on the elliptical machine. He reports exertional shortness of breath as well as shortness of breath at rest. He has had multiple ED visits since his diagnosis for shortness of breath. CTA chest was performed on 06/17/20 which did not show pulmonary emboli but demonstrated patchy bilateral nodular airspace opacities. He then had chest radiographs on 06/23/20 and 06/25/20 which did not demonstrate opacities. An ABG on 06/23/20 showed pH 7.756, PCO 12.8, PO2 122.  He has been taking budesonide nebulizer treatments twice daily and albuterol as needed. He was also recently prescribed symbicort but has not started this inhaler yet. He says he does find some relief from the nebulizer treatments. He has also be taking hydroxyzine for anxiety and trying to incorporate breathing exercises for relaxation.      He complains of severe nasal congestion since covid which has been making it difficult for him to sleep. He reports using rhinocort along with sinus rinses without much improvement. He reports seasonal allergies, worse in the Spring and Fall.     Past Medical History:  Diagnosis Date   Adjustment disorder with mixed emotional features    Atypical chest pain 02/2019   COVID-19 virus infection 05/2020   Rosanna Randy disease  Insomnia    Lateral malleolar fracture 09/2020   RIGHT (closed, nondisplaced)   Migraine syndrome    Palpitations    ongoing cardiology eval (rhythm monitoring) as of 02/2021   Peyronie's disease 01/2013   Urology: Vit E 400 U/day   Pneumonia    Retinal artery occlusion 01/2012   Cardiac event monitor neg; carotid dopplers and transthoracic echo normal.  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 (hearing loss + BRAO)     Family  History  Problem Relation Age of Onset   Parkinsonism Mother 43   Arthritis Father      Social History   Socioeconomic History   Marital status: Married    Spouse name: Not on file   Number of children: Not on file   Years of education: Not on file   Highest education level: Not on file  Occupational History   Not on file  Tobacco Use   Smoking status: Never   Smokeless tobacco: Never  Substance and Sexual Activity   Alcohol use: Yes    Alcohol/week: 1.0 - 2.0 standard drink    Types: 1 - 2 Cans of beer per week   Drug use: No   Sexual activity: Yes  Other Topics Concern   Not on file  Social History Narrative   Married, 5 children.   Orig from [place], went to college in [place].  Has one sister-healthy.   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   Exercises 5 days a week (runs/lifts wts).     No T/A/Ds.   Social Determinants of Health   Financial Resource Strain: Not on file  Food Insecurity: Not on file  Transportation Needs: Not on file  Physical Activity: Not on file  Stress: Not on file  Social Connections: Not on file  Intimate Partner Violence: Not on file     Allergies  Allergen Reactions   Food     Green Peppers: GI upset     Outpatient Medications Prior to Visit  Medication Sig Dispense Refill   aspirin EC 81 MG tablet Take 81 mg by mouth daily. Swallow whole.     budesonide-formoterol (SYMBICORT) 160-4.5 MCG/ACT inhaler Inhale 2 puffs into the lungs 2 (two) times daily. 1 each 3   Cholecalciferol (VITAMIN D3 ADULT GUMMIES PO) Take by mouth.     citalopram (CELEXA) 20 MG tablet Take 1 tablet (20 mg total) by mouth daily. 30 tablet 1   [employer] VITAMIN C 1000 MG tablet Take 2,000 mg by mouth daily.     fluticasone (FLONASE) 50 MCG/ACT nasal spray Place into the nose.     LORazepam (ATIVAN) 1 MG tablet 1-2 tabs po bid prn anxiety and/or insomnia 90 tablet 0   pantoprazole (PROTONIX) 40 MG tablet 1 tab po qAM for acid reflux 90  tablet 3   traMADol (ULTRAM) 50 MG tablet 1-2 tabs po qd as needed for a persistent headache 15 tablet 0   No facility-administered medications prior to visit.    Review of Systems  Constitutional:  Negative for chills, diaphoresis, fever, malaise/fatigue and weight loss.  HENT:  Negative for congestion, ear pain, nosebleeds, sinus pain and sore throat.   Eyes:  Negative for blurred vision.  Respiratory:  Positive for shortness of breath and wheezing (intermittent). Negative for cough, hemoptysis and sputum production.   Cardiovascular:  Negative for chest pain, palpitations, orthopnea, claudication, leg swelling and PND.  Gastrointestinal:  Negative for abdominal pain, blood in  stool, constipation, diarrhea, heartburn, melena, nausea and vomiting.  Genitourinary:  Negative for hematuria.  Musculoskeletal:  Negative for joint pain and myalgias.  Skin:  Negative for itching and rash.  Neurological:  Negative for dizziness and weakness.  Endo/Heme/Allergies:  Does not bruise/bleed easily.  Psychiatric/Behavioral:  The patient is nervous/anxious and has insomnia.    Objective:   Vitals:   04/04/21 1454  BP: 118/84  Pulse: 84  SpO2: 98%  Weight: 248 lb 9.6 oz (112.8 kg)  Height: 6' 5"  (1.956 m)     Physical Exam Constitutional:      Appearance: Normal appearance. He is normal weight.  HENT:     Head: Normocephalic and atraumatic.     Nose:     Comments: Erythematous nasal passages    Mouth/Throat:     Mouth: Mucous membranes are moist.     Pharynx: Oropharynx is clear.  Eyes:     General: No scleral icterus.    Extraocular Movements: Extraocular movements intact.     Conjunctiva/sclera: Conjunctivae normal.     Pupils: Pupils are equal, round, and reactive to light.  Cardiovascular:     Rate and Rhythm: Normal rate and regular rhythm.     Pulses: Normal pulses.     Heart sounds: Normal heart sounds. No murmur heard. Pulmonary:     Effort: Pulmonary effort is normal. No  respiratory distress.     Breath sounds: Decreased air movement present. No wheezing or rhonchi.  Abdominal:     General: Bowel sounds are normal. There is no distension.     Palpations: Abdomen is soft.  Musculoskeletal:     Right lower leg: No edema.     Left lower leg: No edema.  Neurological:     General: No focal deficit present.     Mental Status: He is alert and oriented to person, place, and time. Mental status is at baseline.  Psychiatric:        Mood and Affect: Mood normal.        Behavior: Behavior normal.        Thought Content: Thought content normal.        Judgment: Judgment normal.    CBC    Component Value Date/Time   WBC 5.7 03/01/2021 1035   RBC 5.34 03/01/2021 1035   HGB 15.9 03/01/2021 1035   HCT 46.1 03/01/2021 1035   PLT 217.0 03/01/2021 1035   MCV 86.3 03/01/2021 1035   MCH 29.6 01/29/2012 0848   MCHC 34.5 03/01/2021 1035   RDW 13.1 03/01/2021 1035   LYMPHSABS 1.7 03/01/2021 1035   MONOABS 0.4 03/01/2021 1035   EOSABS 0.1 03/01/2021 1035   BASOSABS 0.0 03/01/2021 1035   BMP Latest Ref Rng & Units 03/01/2021 08/03/2020 03/23/2019  Glucose 70 - 99 mg/dL 91 88 98  BUN 6 - 23 mg/dL 16 15 16   Creatinine 0.40 - 1.50 mg/dL 0.99 1.00 0.85  Sodium 135 - 145 mEq/L 141 141 139  Potassium 3.5 - 5.1 mEq/L 4.5 4.1 4.2  Chloride 96 - 112 mEq/L 103 106 102  CO2 19 - 32 mEq/L 24 26 26   Calcium 8.4 - 10.5 mg/dL 9.8 9.9 10.6(H)    Chest imaging: CXR 04/04/21 Diffuse interstitial prominence, nonspecific.  No acute abnormality  CTA Chest 06/17/20 Cardiovascular: Heart is normal size. Thoracic aorta is normal. Takeoff of the great vessels is unremarkable. Pulmonary arterial system is well opacified without evidence of emboli. Remaining vascular structures are unremarkable.  Mediastinum/Nodes: No significant mediastinal or  hilar adenopathy. Remaining mediastinal structures are normal.  Lungs/Pleura: Lungs are adequately inflated and demonstrate patchy bilateral  nodular airspace process likely multifocal pneumonia. No effusion. Airways are normal.  Upper Abdomen: No acute findings. Previous cholecystectomy.  Musculoskeletal: Mild loss of anterior vertebral body height of 2 adjacent midthoracic vertebral bodies.  06/25/20 CXR Cardiovascular: Cardiac silhouette and pulmonary vasculature are within normal limits.  Mediastinum: Within normal limits.  Lungs/pleura: No focal pulmonary opacities. No pleural effusion. No pneumothorax.  Upper abdomen: Visualized portions are unremarkable.  Chest wall/osseous structures: Unremarkable.   Labs: 06/25/20 BMP: Na 138, K 3.8, Cl 103, CO2 26, BUN 16 Cr 0.85, Ca 9.7 CBC: WBC 10.2, Hgb 14.7, Hct 43.7, Plt 257 An ABG on 06/23/20 showed pH 7.756, PCO 12.8, PO2 122.     Assessment & Plan:   Reactive airways dysfunction syndrome (HCC)  Shortness of breath - Plan: Pulmonary Function Test  Discussion: Henry [name] is a 50 year old male, non-smoker with recurrent shortness of breath.  He has recurrent shortness of breath with intermittent wheezing. This is concerning for reactive airways disease after another potential viral illness.   He is to resume symbicort 160-26mg 2 puffs twice daily and as needed albuterol.   His chest radiograph shows increased interstitial prominence. We will check pulmonary function tests and if abnormalities are noted there, we will then obtain a HRCT chest for further evaluation.   No signs of overt rheumatologic condition at this time.  Patient to follow up in 3 months  JFreda Jackson MD [facility] Office: [phone]   Current Outpatient Medications:    aspirin EC 81 MG tablet, Take 81 mg by mouth daily. Swallow whole., Disp: , Rfl:    budesonide-formoterol (SYMBICORT) 160-4.5 MCG/ACT inhaler, Inhale 2 puffs into the lungs 2 (two) times daily., Disp: 1 each, Rfl: 3   Cholecalciferol (VITAMIN D3 ADULT GUMMIES PO), Take by mouth., Disp: , Rfl:     citalopram (CELEXA) 20 MG tablet, Take 1 tablet (20 mg total) by mouth daily., Disp: 30 tablet, Rfl: 1   [employer]S VITAMIN C 1000 MG tablet, Take 2,000 mg by mouth daily., Disp: , Rfl:    fluticasone (FLONASE) 50 MCG/ACT nasal spray, Place into the nose., Disp: , Rfl:    LORazepam (ATIVAN) 1 MG tablet, 1-2 tabs po bid prn anxiety and/or insomnia, Disp: 90 tablet, Rfl: 0   pantoprazole (PROTONIX) 40 MG tablet, 1 tab po qAM for acid reflux, Disp: 90 tablet, Rfl: 3   traMADol (ULTRAM) 50 MG tablet, 1-2 tabs po qd as needed for a persistent headache, Disp: 15 tablet, Rfl: 0

## 2021-04-05 ENCOUNTER — Encounter: Payer: Self-pay | Admitting: Family Medicine

## 2021-04-05 NOTE — Telephone Encounter (Signed)
Dr. Erin Fulling, please see mychart message sent by pt and advise.

## 2021-04-07 NOTE — Telephone Encounter (Signed)
JD please advise. Thanks  Hello Dr. Erin Fulling.  I had another episode like a described on May 26th tonight sitting doing a puzzle.  Heart rate jumped, cold sweats, and felt very strange.  I read something called POTS that Belva Bertin has identified with some post Covid patients where lung scarring was present.  It causes tachycardia but it is random.  Have you read anything about this?  I do seem to have lung issues ever since Covid and experiencing a number of issues at present as you know.  I've actually felt worse these past couple of days.  There seems to a specific test for syndrome.  I don't see my issues as anxiety related at all and feel there is something driving all of these issues.  Is this something we can investigate?  Thanks,  Henry [name]

## 2021-04-08 ENCOUNTER — Other Ambulatory Visit (HOSPITAL_COMMUNITY)
Admission: RE | Admit: 2021-04-08 | Discharge: 2021-04-08 | Disposition: A | Discharge status: Home / Self Care | Payer: BC Managed Care – PPO | Source: Ambulatory Visit | Attending: Pulmonary Disease | Admitting: Pulmonary Disease

## 2021-04-08 DIAGNOSIS — Z01812 Encounter for preprocedural laboratory examination: Secondary | ICD-10-CM | POA: Diagnosis not present

## 2021-04-08 DIAGNOSIS — Z20822 Contact with and (suspected) exposure to covid-19: Secondary | ICD-10-CM | POA: Diagnosis not present

## 2021-04-08 LAB — EXTERNAL GENERIC LAB PROCEDURE: COLOGUARD: NEGATIVE

## 2021-04-08 LAB — COLOGUARD: COLOGUARD: NEGATIVE

## 2021-04-08 MED ORDER — PREDNISONE 10 MG PO TABS: ORAL_TABLET | ORAL | 0 refills | Status: DC

## 2021-04-08 MED ORDER — LEVOFLOXACIN 750 MG PO TABS: 750.0000 mg | ORAL_TABLET | Freq: Every day | ORAL | 0 refills | Status: DC

## 2021-04-08 NOTE — Telephone Encounter (Signed)
Dr. Erin Fulling please advise  Thank for the response.  I understand the treatment plan but wondered if there was a different antibiotic he could prescribe.  Also, is the new inhaler dosage (160/4.5 vs 80/4.5) okay?  Sorry to keep asking.  Just want to clear on options. Regards, [name] Lucendia Herrlich

## 2021-04-08 NOTE — Telephone Encounter (Signed)
Dr. Erin Fulling please advise on patient mychart message. Thank you    Thank you for getting back to me.  Given some of my other symptoms like racing heart already present, is it safe to use these medications.  I've had reactions to prednisone before that caused racing heart and insomnia so I am bit concerned with those conditions already being present.  Also, is it okay to use the Symbicort inhaler they sent with a strength of 160/4.5 instead of the 80/4.5? Sorry, one more question.  Is there a milder antibiotic I can take?  The congestion isn't too bad at this point and I read the interactions can be pretty severe. Thanks, Henry [name]

## 2021-04-09 ENCOUNTER — Telehealth: Payer: Self-pay

## 2021-04-09 LAB — SARS CORONAVIRUS 2 (TAT 6-24 HRS): SARS Coronavirus 2: NEGATIVE

## 2021-04-09 MED ORDER — AMOXICILLIN-POT CLAVULANATE 875-125 MG PO TABS: 1.0000 | ORAL_TABLET | Freq: Two times a day (BID) | ORAL | 0 refills | Status: AC

## 2021-04-09 NOTE — Telephone Encounter (Signed)
Patient returned call, confirmed DOB. Patient states the antibiotic he was prescribed has several contra-indications with several of his other medications. He is wanting to know if another antibiotic can be scheduled. He has already started taking the prednisone. He confirms that he does not have any allergies to any medications. He also wanted [place] to be aware that his PCP increased his dosage on his symbicort. I made him aware we would be sure to let him know. He states he is not having a ton on congestion and does not feel that bad but still thinks he needs the antibiotic.   JD please advise, is there another antibiotic that we could send in for patient.

## 2021-04-09 NOTE — Telephone Encounter (Signed)
Call made to patient, confirmed DOB. Made aware of [place] recommendations. Confirmed pharmacy. Medication sent.   Nothing further needed at this time.

## 2021-04-09 NOTE — Telephone Encounter (Signed)
LM with  patient as there is multiple messages going back and forth via email.   Please see if someone in triage is available when he calls back if not, please ask what is a good time to call him back.

## 2021-04-10 ENCOUNTER — Other Ambulatory Visit: Payer: Self-pay | Admitting: Family Medicine

## 2021-04-10 MED ORDER — LORAZEPAM 1 MG PO TABS: ORAL_TABLET | ORAL | 0 refills | Status: DC

## 2021-04-10 NOTE — Telephone Encounter (Signed)
Requesting: Lorazepam Contract: 03/20/21 UDS: n/a Last Visit: 03/20/21 Next Visit: 04/18/21 Last Refill: 03/20/21(90,0)  Please Advise. Medication pending

## 2021-04-10 NOTE — Telephone Encounter (Signed)
Called left pt a message offering  a Monday 04/15/21 nurse visit at 2 pm.  I asked him to either call the office or send a my chart message with his reply.

## 2021-04-11 ENCOUNTER — Other Ambulatory Visit: Payer: Self-pay

## 2021-04-11 ENCOUNTER — Other Ambulatory Visit: Payer: Self-pay | Admitting: Family Medicine

## 2021-04-11 ENCOUNTER — Ambulatory Visit (INDEPENDENT_AMBULATORY_CARE_PROVIDER_SITE_OTHER): Payer: BC Managed Care – PPO | Admitting: Pulmonary Disease

## 2021-04-11 DIAGNOSIS — R0602 Shortness of breath: Secondary | ICD-10-CM | POA: Diagnosis not present

## 2021-04-11 HISTORY — PX: OTHER SURGICAL HISTORY: SHX169

## 2021-04-11 LAB — PULMONARY FUNCTION TEST
DL/VA % pred: 140 %
DL/VA: 6.11 ml/min/mmHg/L
DLCO cor % pred: 130 %
DLCO cor: 46.15 ml/min/mmHg
DLCO unc % pred: 130 %
DLCO unc: 46.15 ml/min/mmHg
FEF 25-75 Post: 3.8 L/sec
FEF 25-75 Pre: 3.91 L/sec
FEF2575-%Change-Post: -2 %
FEF2575-%Pred-Post: 92 %
FEF2575-%Pred-Pre: 95 %
FEV1-%Change-Post: 0 %
FEV1-%Pred-Post: 84 %
FEV1-%Pred-Pre: 84 %
FEV1-Post: 4 L
FEV1-Pre: 4 L
FEV1FVC-%Change-Post: 2 %
FEV1FVC-%Pred-Pre: 101 %
FEV6-%Change-Post: -2 %
FEV6-%Pred-Post: 83 %
FEV6-%Pred-Pre: 85 %
FEV6-Post: 4.94 L
FEV6-Pre: 5.09 L
FEV6FVC-%Pred-Post: 103 %
FEV6FVC-%Pred-Pre: 103 %
FVC-%Change-Post: -2 %
FVC-%Pred-Post: 80 %
FVC-%Pred-Pre: 82 %
FVC-Post: 4.94 L
FVC-Pre: 5.09 L
Post FEV1/FVC ratio: 81 %
Post FEV6/FVC ratio: 100 %
Pre FEV1/FVC ratio: 79 %
Pre FEV6/FVC Ratio: 100 %
RV % pred: 119 %
RV: 2.8 L
TLC % pred: 101 %
TLC: 8.27 L

## 2021-04-11 NOTE — Patient Instructions (Signed)
Full PFT performed today. °

## 2021-04-11 NOTE — Progress Notes (Signed)
Full PFT performed today. °

## 2021-04-15 ENCOUNTER — Other Ambulatory Visit: Payer: Self-pay

## 2021-04-15 ENCOUNTER — Encounter: Payer: Self-pay | Admitting: Family Medicine

## 2021-04-15 ENCOUNTER — Other Ambulatory Visit: Payer: Self-pay | Admitting: Family Medicine

## 2021-04-15 NOTE — Telephone Encounter (Signed)
Received the following message from patient:   "Hello.  I assume this is the result of the pulmonary function test.  What were the results?  Why do I have issues breathing out but not so much when breathing in?  Will it be posted to MyChart?  Also, I have other symptoms that preceded the breathing issues that have resurfaced including rapid pulse, pounding heartbeat, and sudden onset of racing heart (pulse ~120-130 bps) with cold sweats.  I am working with my primary doctor and cardiologist to manage all of these but it is unclear how all this may fit together or be managed together.  How would you recommend proceeding on the total treatment?  What is the ultimate diagnosis and how should I think about managing it going forward? Thanks, Henry [name]"  I looked at his after visit summary and see that it was recommended that he RTC in October. Due to his multiple questions, do you feel he needs an appt sooner?

## 2021-04-16 NOTE — Telephone Encounter (Signed)
Hi [name]. I will definitely take a look at everything Dr. Erin Fulling did and I'll see you on the 21st. --PM

## 2021-04-16 NOTE — Telephone Encounter (Signed)
FYI

## 2021-04-18 ENCOUNTER — Other Ambulatory Visit: Payer: Self-pay

## 2021-04-18 ENCOUNTER — Ambulatory Visit: Payer: BC Managed Care – PPO | Admitting: Family Medicine

## 2021-04-18 ENCOUNTER — Encounter: Payer: Self-pay | Admitting: Family Medicine

## 2021-04-18 VITALS — BP 104/70 | HR 97 | Temp 97.8°F | Ht 77.0 in | Wt 241.0 lb

## 2021-04-18 DIAGNOSIS — R9389 Abnormal findings on diagnostic imaging of other specified body structures: Secondary | ICD-10-CM

## 2021-04-18 DIAGNOSIS — G4701 Insomnia due to medical condition: Secondary | ICD-10-CM

## 2021-04-18 DIAGNOSIS — G933 Postviral fatigue syndrome: Secondary | ICD-10-CM

## 2021-04-18 DIAGNOSIS — U099 Post covid-19 condition, unspecified: Secondary | ICD-10-CM

## 2021-04-18 DIAGNOSIS — R0602 Shortness of breath: Secondary | ICD-10-CM

## 2021-04-18 DIAGNOSIS — J45909 Unspecified asthma, uncomplicated: Secondary | ICD-10-CM

## 2021-04-18 DIAGNOSIS — R000 Tachycardia, unspecified: Secondary | ICD-10-CM | POA: Diagnosis not present

## 2021-04-18 DIAGNOSIS — G9331 Postviral fatigue syndrome: Secondary | ICD-10-CM

## 2021-04-18 MED ORDER — PROPRANOLOL HCL 20 MG PO TABS: ORAL_TABLET | ORAL | 0 refills | Status: DC

## 2021-04-18 MED ORDER — PREDNISONE 10 MG PO TABS: ORAL_TABLET | ORAL | 0 refills | Status: AC
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
## 2021-04-20 ENCOUNTER — Other Ambulatory Visit: Payer: Self-pay | Admitting: Family Medicine

## 2021-04-21 ENCOUNTER — Encounter: Payer: Self-pay | Admitting: Family Medicine

## 2021-04-22 MED ORDER — FLUTICASONE PROPIONATE HFA 110 MCG/ACT IN AERO: 1.0000 | INHALATION_SPRAY | Freq: Two times a day (BID) | RESPIRATORY_TRACT | 12 refills | Status: DC

## 2021-04-22 NOTE — Telephone Encounter (Signed)
RF for Lorazepam Contract: 03/20/21 UDS: n/a Last Visit: 04/18/21 Next Visit: 05/02/21 Last Refill:04/10/21(90,0)  Please Advise. Med pending

## 2021-04-22 NOTE — Telephone Encounter (Signed)
It is predominantly the steroid part of the symbicort that can cause ST and voice changes, so switching to flovent won't likely help but we can give it a try. I'll sent in rx. Sorry to hear the reaction you had to taking propranolol.

## 2021-04-22 NOTE — Telephone Encounter (Signed)
Dr. Erin Fulling, please see mychart message sent by pt and advise: Nolon Rod "[name]"  Freddi Starr, MD 11 hours ago (9:50 PM)  Hello Dr. Erin Fulling. I hope all is well.   I think I am getting a number of bad side effects from the Symbicort including significant hoarseness, sore throat, chest pain, and nasal congestion I can't seem to shake.  I can hardly talk and not able to really work without talking.  Also, I mentioned some instances of racing heart and palpitations when just sitting and not doing anything.  Well, these have increased even though I feel like my breathing is a bit better.  I read that extended use of Symbicort can be a cause of this. I've been on Symbicort since really Covid last September/October.  I had weened myself off to one puff a day but never really quit.  Now I have gone to 4 puffs a day at two times the dosage.  Can we switch to an alternate medication?  I read Flovent is an alternative but does not have these side effects.  I know the Symbicort is causing the issues with talking which I need to reverse asap.  I am hoping the heart issues will decrease as well.     I had a couple of follow-up questions following the PFT I took.  I never really got a real reading from the test though I got the results and wanted to understand what it suggested my current status was.  Should I be doing breathing exercises?  Any preventative measures I should consider?   Please let me know as soon as possible how to proceed.  Happy to make an appointment but hope we can do something with the prescription as fast as possible.   Thanks, Dorise Hiss

## 2021-04-22 NOTE — Telephone Encounter (Signed)
Please advise 

## 2021-04-23 ENCOUNTER — Encounter: Payer: Self-pay | Admitting: Family Medicine

## 2021-04-25 ENCOUNTER — Other Ambulatory Visit: Payer: Self-pay | Admitting: Family Medicine

## 2021-04-30 NOTE — Telephone Encounter (Signed)
New mychart message sent by pt which is posted below: Henry Rod "[name]"  Freddi Starr, MD 22 hours ago (10:39 AM)    Thank you for getting back to me.  Switching from Symbicort has seemed to help with the palpitations and heart racing issue.  Still present to a degree but much better right now.  As far as the potential for asthma, how do I proceed from here?  Is there a way to confirm the diagnosis?  What are treatment options?  Anything I can do for prevention of future issues?  What should I guard against?  I am starting to exercise again and want to understand my condition.  Again, if it makes more sense to schedule an appointment, let me know.  Thanks, Henry [name]  Dr. Erin Fulling please advise.

## 2021-05-02 ENCOUNTER — Encounter: Payer: Self-pay | Admitting: Family Medicine

## 2021-05-02 ENCOUNTER — Ambulatory Visit: Payer: BC Managed Care – PPO | Admitting: Family Medicine

## 2021-05-02 ENCOUNTER — Other Ambulatory Visit: Payer: Self-pay

## 2021-05-02 VITALS — BP 98/63 | HR 80 | Temp 98.3°F | Resp 16 | Ht 74.0 in | Wt 241.0 lb

## 2021-05-02 DIAGNOSIS — J45909 Unspecified asthma, uncomplicated: Secondary | ICD-10-CM

## 2021-05-02 DIAGNOSIS — R000 Tachycardia, unspecified: Secondary | ICD-10-CM

## 2021-05-02 DIAGNOSIS — G933 Postviral fatigue syndrome: Secondary | ICD-10-CM

## 2021-05-02 DIAGNOSIS — G4701 Insomnia due to medical condition: Secondary | ICD-10-CM

## 2021-05-02 DIAGNOSIS — G9331 Postviral fatigue syndrome: Secondary | ICD-10-CM
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
## 2021-05-06 ENCOUNTER — Encounter: Payer: Self-pay | Admitting: Family Medicine

## 2021-05-06 DIAGNOSIS — J45909 Unspecified asthma, uncomplicated: Secondary | ICD-10-CM

## 2021-05-06 DIAGNOSIS — U099 Post covid-19 condition, unspecified: Secondary | ICD-10-CM

## 2021-05-07 NOTE — Telephone Encounter (Signed)
Try ibuprofen over the counter pills-->take 600 mg every 6 hours to see if it helps better than tylenol. Can you send a picture of the spot on your chest in MyChart?

## 2021-05-07 NOTE — Telephone Encounter (Signed)
Please see attachment below.

## 2021-05-07 NOTE — Telephone Encounter (Signed)
I can't tell anything from the picture. It would be best just to see him in person: I have 4Pm open wed and thurs, and 3:30 and 4 pm open on Friday.

## 2021-05-07 NOTE — Telephone Encounter (Signed)
Pt sched for Friday for asthma tx plan. Please advise

## 2021-05-07 NOTE — Telephone Encounter (Signed)
Actually now I see he is already on my schedule for this Friday morning.

## 2021-05-08 NOTE — Telephone Encounter (Signed)
FYI for Friday appt. Please see below.

## 2021-05-08 NOTE — Telephone Encounter (Signed)
Nothing further needed. Pt has scheduled appt later this week for provider evaluation.

## 2021-05-08 NOTE — Telephone Encounter (Signed)
Referral ordered. I'll talk with him about symptoms and any further imaging at next o/v.

## 2021-05-10 ENCOUNTER — Other Ambulatory Visit: Payer: Self-pay

## 2021-05-10 ENCOUNTER — Encounter: Payer: Self-pay | Admitting: Family Medicine

## 2021-05-10 ENCOUNTER — Ambulatory Visit: Payer: BC Managed Care – PPO | Admitting: Family Medicine

## 2021-05-10 VITALS — BP 100/66 | HR 79 | Temp 97.5°F | Resp 16 | Ht 74.0 in | Wt 236.0 lb

## 2021-05-10 DIAGNOSIS — J45909 Unspecified asthma, uncomplicated: Secondary | ICD-10-CM

## 2021-05-10 DIAGNOSIS — J849 Interstitial pulmonary disease, unspecified: Secondary | ICD-10-CM

## 2021-05-10 DIAGNOSIS — U099 Post covid-19 condition, unspecified: Secondary | ICD-10-CM

## 2021-05-10 DIAGNOSIS — R1013 Epigastric pain: Secondary | ICD-10-CM | POA: Diagnosis not present

## 2021-05-10 DIAGNOSIS — T887XXA Unspecified adverse effect of drug or medicament, initial encounter: Secondary | ICD-10-CM
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
## 2021-05-10 NOTE — Patient Instructions (Signed)
Call you insurer and see if any of the following inhalers are "preferred" on your drug formulary: Bevespi, Stiolto, Ultibro, and Anoro. Call and let us know and I'll send in appropriate rx.

## 2021-05-15 ENCOUNTER — Other Ambulatory Visit: Payer: Self-pay

## 2021-05-15 ENCOUNTER — Encounter: Payer: Self-pay | Admitting: Family Medicine

## 2021-05-15 DIAGNOSIS — R1013 Epigastric pain: Secondary | ICD-10-CM

## 2021-05-15 MED ORDER — FLUTICASONE PROPIONATE HFA 110 MCG/ACT IN AERO: 1.0000 | INHALATION_SPRAY | Freq: Two times a day (BID) | RESPIRATORY_TRACT | 4 refills | Status: DC

## 2021-05-17 NOTE — Telephone Encounter (Signed)
[name], In my medical opinion, you do not need a CT scan of your stomach. Pls schedule the CT of your chest like we talked about.

## 2021-05-20 ENCOUNTER — Ambulatory Visit (HOSPITAL_BASED_OUTPATIENT_CLINIC_OR_DEPARTMENT_OTHER)
Admission: RE | Admit: 2021-05-20 | Discharge: 2021-05-20 | Disposition: A | Discharge status: Home / Self Care | Payer: BC Managed Care – PPO | Source: Ambulatory Visit | Attending: Family Medicine | Admitting: Family Medicine

## 2021-05-20 ENCOUNTER — Other Ambulatory Visit: Payer: Self-pay

## 2021-05-20 DIAGNOSIS — J849 Interstitial pulmonary disease, unspecified: Secondary | ICD-10-CM | POA: Diagnosis not present

## 2021-05-20 MED ORDER — SUCRALFATE 1 G PO TABS: ORAL_TABLET | ORAL | 1 refills | Status: DC

## 2021-05-20 NOTE — Telephone Encounter (Signed)
Sure, I think carafate is a good idea--I'll do eRx.  OK to stop the pantoprazole.

## 2021-05-22 NOTE — Telephone Encounter (Signed)
Please review and advise.

## 2021-05-22 NOTE — Telephone Encounter (Signed)
I know he's frustrated.  Unfortunately I really don't have any answers, but we'll keep trying. However, I'm trying to do the testing that makes the most sense given the symptoms he is dealing with.  Oftentimes if we resort to over-testing it only creates more questions and uncertainties.  We have to think about the probability of any test giving [place] meaningful information.  It is reassuring that his chest CT scan was normal.  I feel certain that his diaphragm is not causing any of this.  I agree the asthma diagnosis is a bit of a stretch and he can stop the inhaler since it is not making a difference.   Besides the pantoprazole and carafate there are no meds for his stomach pains that I can recommend. I do want to repeat some blood work since we have not checked any since he started getting his abdominal pain and it is persisting. I'll order CMET, cbc, lipase. Also, will refer him to gastroenterology. They may want to do upper endoscopy.

## 2021-05-23 ENCOUNTER — Encounter: Payer: Self-pay | Admitting: Gastroenterology

## 2021-05-23 NOTE — Telephone Encounter (Signed)
Pt requesting to have pantoprazole Rx changed due to suggested increase in note 8 days ago.

## 2021-05-23 NOTE — Telephone Encounter (Signed)
LM for pt to return call to discuss.

## 2021-05-24 ENCOUNTER — Encounter: Payer: Self-pay | Admitting: Family Medicine

## 2021-05-24 ENCOUNTER — Ambulatory Visit (INDEPENDENT_AMBULATORY_CARE_PROVIDER_SITE_OTHER): Payer: BC Managed Care – PPO

## 2021-05-24 ENCOUNTER — Other Ambulatory Visit: Payer: Self-pay

## 2021-05-24 DIAGNOSIS — R1013 Epigastric pain: Secondary | ICD-10-CM | POA: Diagnosis not present

## 2021-05-24 LAB — CBC WITH DIFFERENTIAL/PLATELET
Basophils Absolute: 0 10*3/uL (ref 0.0–0.1)
Basophils Relative: 0.4 % (ref 0.0–3.0)
Eosinophils Absolute: 0.1 10*3/uL (ref 0.0–0.7)
Eosinophils Relative: 0.9 % (ref 0.0–5.0)
HCT: 43.5 % (ref 39.0–52.0)
Hemoglobin: 14.7 g/dL (ref 13.0–17.0)
Lymphocytes Relative: 19 % (ref 12.0–46.0)
Lymphs Abs: 1.2 10*3/uL (ref 0.7–4.0)
MCHC: 33.8 g/dL (ref 30.0–36.0)
MCV: 87.7 fl (ref 78.0–100.0)
Monocytes Absolute: 0.4 10*3/uL (ref 0.1–1.0)
Monocytes Relative: 6.6 % (ref 3.0–12.0)
Neutro Abs: 4.6 10*3/uL (ref 1.4–7.7)
Neutrophils Relative %: 73.1 % (ref 43.0–77.0)
Platelets: 242 10*3/uL (ref 150.0–400.0)
RBC: 4.96 Mil/uL (ref 4.22–5.81)
RDW: 13.5 % (ref 11.5–15.5)
WBC: 6.3 10*3/uL (ref 4.0–10.5)

## 2021-05-24 LAB — COMPREHENSIVE METABOLIC PANEL
ALT: 64 U/L — ABNORMAL HIGH (ref 0–53)
AST: 31 U/L (ref 0–37)
Albumin: 4.4 g/dL (ref 3.5–5.2)
Alkaline Phosphatase: 52 U/L (ref 39–117)
BUN: 13 mg/dL (ref 6–23)
CO2: 26 mEq/L (ref 19–32)
Calcium: 10 mg/dL (ref 8.4–10.5)
Chloride: 102 mEq/L (ref 96–112)
Creatinine, Ser: 0.95 mg/dL (ref 0.40–1.50)
GFR: 93.86 mL/min (ref >60.00)
Glucose, Bld: 115 mg/dL — ABNORMAL HIGH (ref 70–99)
Potassium: 4.3 mEq/L (ref 3.5–5.1)
Sodium: 139 mEq/L (ref 135–145)
Total Bilirubin: 2.2 mg/dL — ABNORMAL HIGH (ref 0.2–1.2)
Total Protein: 6.7 g/dL (ref 6.0–8.3)

## 2021-05-24 LAB — LIPASE: Lipase: 22 U/L (ref 11.0–59.0)

## 2021-05-24 MED ORDER — OMEPRAZOLE 40 MG PO CPDR: 40.0000 mg | DELAYED_RELEASE_CAPSULE | Freq: Every day | ORAL | 3 refills | Status: DC

## 2021-05-24 NOTE — Telephone Encounter (Signed)
OK we'll try omeprazole, I'll eRx now.

## 2021-05-27 ENCOUNTER — Encounter: Payer: Self-pay | Admitting: Family Medicine

## 2021-05-29 ENCOUNTER — Other Ambulatory Visit: Payer: Self-pay

## 2021-05-29 ENCOUNTER — Encounter: Payer: Self-pay | Admitting: Family Medicine

## 2021-05-29 ENCOUNTER — Ambulatory Visit: Payer: BC Managed Care – PPO | Admitting: Family Medicine

## 2021-05-29 ENCOUNTER — Other Ambulatory Visit: Payer: Self-pay | Admitting: Family Medicine

## 2021-05-29 VITALS — BP 102/67 | HR 78 | Temp 97.5°F | Ht 74.0 in | Wt 225.2 lb

## 2021-05-29 DIAGNOSIS — R6881 Early satiety: Secondary | ICD-10-CM | POA: Diagnosis not present

## 2021-05-29 DIAGNOSIS — R1013 Epigastric pain: Secondary | ICD-10-CM | POA: Diagnosis not present

## 2021-05-29 DIAGNOSIS — R634 Abnormal weight loss: Secondary | ICD-10-CM | POA: Diagnosis not present

## 2021-05-29 MED ORDER — SUCRALFATE 1 G PO TABS: ORAL_TABLET | ORAL | 1 refills | Status: DC

## 2021-05-29 MED ORDER — PANTOPRAZOLE SODIUM 40 MG PO TBEC: 40.0000 mg | DELAYED_RELEASE_TABLET | Freq: Two times a day (BID) | ORAL | 3 refills | Status: DC

## 2021-05-29 MED ORDER — LORAZEPAM 1 MG PO TABS: ORAL_TABLET | ORAL | 0 refills | Status: DC
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
## 2021-05-31 LAB — HEPATIC FUNCTION PANEL
ALT: 102 U/L — ABNORMAL HIGH (ref 0–53)
AST: 52 U/L — ABNORMAL HIGH (ref 0–37)
Albumin: 4.6 g/dL (ref 3.5–5.2)
Alkaline Phosphatase: 58 U/L (ref 39–117)
Bilirubin, Direct: 0.4 mg/dL — ABNORMAL HIGH (ref 0.0–0.3)
Total Bilirubin: 2.6 mg/dL — ABNORMAL HIGH (ref 0.2–1.2)
Total Protein: 6.8 g/dL (ref 6.0–8.3)

## 2021-05-31 LAB — H. PYLORI ANTIBODY, IGG: H Pylori IgG: NEGATIVE

## 2021-06-04 ENCOUNTER — Ambulatory Visit (HOSPITAL_BASED_OUTPATIENT_CLINIC_OR_DEPARTMENT_OTHER): Payer: BC Managed Care – PPO

## 2021-06-04 DIAGNOSIS — R1011 Right upper quadrant pain: Secondary | ICD-10-CM | POA: Diagnosis not present

## 2021-06-04 DIAGNOSIS — Z800 Family history of malignant neoplasm of digestive organs: Secondary | ICD-10-CM | POA: Diagnosis not present

## 2021-06-04 DIAGNOSIS — R1013 Epigastric pain: Secondary | ICD-10-CM | POA: Diagnosis not present

## 2021-06-04 DIAGNOSIS — Z1211 Encounter for screening for malignant neoplasm of colon: Secondary | ICD-10-CM | POA: Diagnosis not present

## 2021-06-05 ENCOUNTER — Ambulatory Visit: Payer: BC Managed Care – PPO | Admitting: Family Medicine

## 2021-06-06 DIAGNOSIS — R748 Abnormal levels of other serum enzymes: Secondary | ICD-10-CM | POA: Diagnosis not present

## 2021-06-06 DIAGNOSIS — R1013 Epigastric pain: Secondary | ICD-10-CM | POA: Diagnosis not present

## 2021-06-06 DIAGNOSIS — R1011 Right upper quadrant pain: Secondary | ICD-10-CM | POA: Diagnosis not present

## 2021-06-06 HISTORY — PX: ESOPHAGOGASTRODUODENOSCOPY: SHX1529

## 2021-06-07 ENCOUNTER — Ambulatory Visit: Payer: BC Managed Care – PPO | Admitting: Family Medicine

## 2021-06-07 DIAGNOSIS — R1013 Epigastric pain: Secondary | ICD-10-CM | POA: Diagnosis not present

## 2021-06-07 DIAGNOSIS — R748 Abnormal levels of other serum enzymes: Secondary | ICD-10-CM | POA: Diagnosis not present

## 2021-06-07 DIAGNOSIS — N281 Cyst of kidney, acquired: Secondary | ICD-10-CM | POA: Diagnosis not present

## 2021-06-07 DIAGNOSIS — R7989 Other specified abnormal findings of blood chemistry: Secondary | ICD-10-CM | POA: Diagnosis not present

## 2021-06-07 DIAGNOSIS — Z800 Family history of malignant neoplasm of digestive organs: Secondary | ICD-10-CM | POA: Diagnosis not present

## 2021-06-07 DIAGNOSIS — Z9049 Acquired absence of other specified parts of digestive tract: Secondary | ICD-10-CM | POA: Diagnosis not present

## 2021-06-11 NOTE — Telephone Encounter (Signed)
Pt was encouraged to keep f/u appt on 9/7 but has to r/s to 9/9 and had to cancel due to another appt already scheduled. During last o/v on 8/31, f/u to be determined based on results.  Please review and advise

## 2021-06-12 ENCOUNTER — Encounter: Payer: Self-pay | Admitting: Family Medicine

## 2021-06-12 DIAGNOSIS — R748 Abnormal levels of other serum enzymes: Secondary | ICD-10-CM | POA: Diagnosis not present

## 2021-06-12 NOTE — Telephone Encounter (Signed)
Make appt to discuss things--any available time slot.

## 2021-06-13 DIAGNOSIS — Z800 Family history of malignant neoplasm of digestive organs: Secondary | ICD-10-CM | POA: Diagnosis not present

## 2021-06-13 DIAGNOSIS — R748 Abnormal levels of other serum enzymes: Secondary | ICD-10-CM | POA: Diagnosis not present

## 2021-06-13 DIAGNOSIS — R1013 Epigastric pain: Secondary | ICD-10-CM | POA: Diagnosis not present

## 2021-06-13 DIAGNOSIS — R1011 Right upper quadrant pain: Secondary | ICD-10-CM | POA: Diagnosis not present

## 2021-06-18 ENCOUNTER — Ambulatory Visit (INDEPENDENT_AMBULATORY_CARE_PROVIDER_SITE_OTHER): Payer: BC Managed Care – PPO | Admitting: Gastroenterology

## 2021-06-18 ENCOUNTER — Encounter: Payer: Self-pay | Admitting: Gastroenterology

## 2021-06-18 VITALS — BP 110/70 | HR 83 | Ht 74.0 in | Wt 217.1 lb

## 2021-06-18 DIAGNOSIS — K219 Gastro-esophageal reflux disease without esophagitis: Secondary | ICD-10-CM

## 2021-06-18 DIAGNOSIS — R1013 Epigastric pain: Secondary | ICD-10-CM

## 2021-06-18 DIAGNOSIS — R140 Abdominal distension (gaseous): Secondary | ICD-10-CM

## 2021-06-18 DIAGNOSIS — R0602 Shortness of breath: Secondary | ICD-10-CM

## 2021-06-18 DIAGNOSIS — R630 Anorexia: Secondary | ICD-10-CM

## 2021-06-18 DIAGNOSIS — R7401 Elevation of levels of liver transaminase levels: Secondary | ICD-10-CM | POA: Diagnosis not present

## 2021-06-18 DIAGNOSIS — R634 Abnormal weight loss: Secondary | ICD-10-CM

## 2021-06-18 DIAGNOSIS — Z800 Family history of malignant neoplasm of digestive organs: Secondary | ICD-10-CM

## 2021-06-18 NOTE — Patient Instructions (Addendum)
If you are age 50 or older, your body mass index should be between 23-30. Your Body mass index is 27.88 kg/m. If this is out of the aforementioned range listed, please consider follow up with your Primary Care Provider.  If you are age 42 or younger, your body mass index should be between 19-25. Your Body mass index is 27.88 kg/m. If this is out of the aformentioned range listed, please consider follow up with your Primary Care Provider.   __________________________________________________________  The [facility] providers would like to encourage you to use [facility] to communicate with providers for non-urgent requests or questions.  Due to long hold times on the telephone, sending your provider a message by [facility] may be a faster and more efficient way to get a response.  Please allow 48 business hours for a response.  Please remember that this is for non-urgent requests.  _______________________________________________________ Due to recent changes in healthcare laws, you may see the results of your imaging and laboratory studies on MyChart before your provider has had a chance to review them.  We understand that in some cases there may be results that are confusing or concerning to you. Not all laboratory results come back in the same time frame and the provider may be waiting for multiple results in order to interpret others.  Please give [place] 48 hours in order for your provider to thoroughly review all the results before contacting the office for clarification of your results.   __________________________________________________________  Please call our office when you have received your final lab reports.   You have been scheduled for an appointment on 08/14/2021 @ 1:20pm
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
## 2021-06-19 DIAGNOSIS — Z800 Family history of malignant neoplasm of digestive organs: Secondary | ICD-10-CM

## 2021-06-19 DIAGNOSIS — R7401 Elevation of levels of liver transaminase levels: Secondary | ICD-10-CM

## 2021-06-21 NOTE — Telephone Encounter (Signed)
Labs that were collected by [facility] reviewed and notable for the following: - NASH FibroSure: Fibrosis score 0.54 (F2-bridging fibrosis with few septa), steatosis score 0.59 (S2-moderate steatosis), Nash score 0.5 (N1-borderline or probable Nash) - AST/ALT 88/180, T bili 2.5  -Otherwise normal/negative haptoglobin, apolipoprotein A1, GGT, TG, total cholesterol - Additional labs available through care everywhere now include the following: - Normal/negative ASMA, ANA, TTG, hepatitis A (needs vaccine), hepatitis B (needs vaccine), HCV, IgA, iron panel, alpha-1 antitrypsin, ceruloplasmin  Aside from fatty liver infiltration with borderline or probable NASH, no clear etiology for elevated liver enzymes which are currently uptrending.  Recommend the following:  - Check antimitochondrial antibody, IgG, IgM, antiliver kidney microsomal antibody - Will follow-up on pending CT scan - Will eventually need hepatitis A and B vaccine series - If above labs and CT unrevealing, plan for referral for liver biopsy to rule out concomitant liver disease - As far as weight loss goes, yes diet/exercise is the most effective treatment for fatty liver disease.  Typically recommend 10% body weight loss, but he is already experiencing weight loss, so do not feel that additional dieting due to this diagnosis is necessary at this juncture

## 2021-06-25 ENCOUNTER — Ambulatory Visit: Payer: BC Managed Care – PPO | Admitting: Pulmonary Disease

## 2021-06-25 ENCOUNTER — Other Ambulatory Visit: Payer: Self-pay

## 2021-06-25 ENCOUNTER — Encounter: Payer: Self-pay | Admitting: Family Medicine

## 2021-06-25 VITALS — BP 114/72 | HR 71 | Temp 97.8°F | Ht 74.0 in | Wt 216.6 lb

## 2021-06-25 DIAGNOSIS — R0602 Shortness of breath: Secondary | ICD-10-CM | POA: Diagnosis not present

## 2021-06-25 NOTE — Telephone Encounter (Signed)
FYI  Please see below

## 2021-06-25 NOTE — Patient Instructions (Signed)
I am glad your breathing symptoms are better.  Your CT chest scan is unremarkable and your breathing tests from earlier this year are normal.   You can stop the flovent inhaler and use as needed if you have return of shortness of breath, cough or wheezing.   Follow up as needed.

## 2021-06-25 NOTE — Progress Notes (Signed)
Synopsis: Referred by Lazaro Arms, NP  for shortness of breath  Subjective:   PATIENT ID: Henry [name] DOB: April 05, 1971, MRN: [id number]  HPI  Chief Complaint  Patient presents with   Follow-up    3 month follow up, CAT scan results, SOB- while sitting   Henry [name] is a 50 year old [name], never smoker who returns to pulmonary clinic for shortness of breath.   His shortness of breath has resolved at this time. He denies issues with cough, wheezing or chest tightness. He is having abdominal fullness and discomfort that is being worked up by gastroenterology at this time.   He is using flovent 1 puff daily and is questioning whether he is safe to discontinue this medication.  OV 04/04/21 He reports since last visit his shortness of breath had completely resolved and he had returned back to working out and weight exercises without issues.  He then developed recurrent symptoms with mainly shortness of breath and intermittent wheezing over the past 3 to 4 weeks.  He reports a potential upper respiratory viral illness about a month ago.  After his breathing had improved he had reduced his Symbicort usage to 1 puff/day which he is currently using.  He denies any cough or sputum production.  He does complain of chest tightness and discomfort more centrally.  He does report trouble sleeping over the last month due to anxiety.  He has had follow-up with his cardiologist with work-up including cardiac monitoring with no arrhythmias noted.  Laboratory work-up through his primary care showed normal chemistry panel and normal CBC with absolute eosinophil count of 100.  OV 07/02/20 He was diagnosed with Covid on 06/01/20 and reports having shortness of breath since. He was unvaccinated. Prior to covid he was active doing 5-10 miles per day on the elliptical machine. He reports exertional shortness of breath as well as shortness of breath at rest. He has had multiple ED visits since his  diagnosis for shortness of breath. CTA chest was performed on 06/17/20 which did not show pulmonary emboli but demonstrated patchy bilateral nodular airspace opacities. He then had chest radiographs on 06/23/20 and 06/25/20 which did not demonstrate opacities. An ABG on 06/23/20 showed pH 7.756, PCO 12.8, PO2 122.  He has been taking budesonide nebulizer treatments twice daily and albuterol as needed. He was also recently prescribed symbicort but has not started this inhaler yet. He says he does find some relief from the nebulizer treatments. He has also be taking hydroxyzine for anxiety and trying to incorporate breathing exercises for relaxation.      He complains of severe nasal congestion since covid which has been making it difficult for him to sleep. He reports using rhinocort along with sinus rinses without much improvement. He reports seasonal allergies, worse in the Spring and Fall.     Past Medical History:  Diagnosis Date   Adjustment disorder with mixed emotional features    Atypical chest pain 02/2019   Colon cancer screening 03/2021   Cologuard neg 03/2021->rpt 3 yrs.   COVID-19 virus infection 05/2020   Rosanna Randy disease    Insomnia    Lateral malleolar fracture 09/2020   RIGHT (closed, nondisplaced)   Migraine syndrome    Palpitations    02/2021 Zio patch --No arrythmia   Peyronie's disease 01/2013   Urology: Vit E 400 U/day   Pneumonia    Retinal artery occlusion 01/2012   Cardiac event monitor neg; carotid dopplers and transthoracic echo normal.  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 (hearing loss + BRAO)     Family History  Problem Relation Age of Onset   Stroke Mother    Parkinsonism Mother 69   Pancreatic cancer Mother    Arthritis Father    Colon cancer Neg Hx    Rectal cancer Neg Hx    Esophageal cancer Neg Hx    Liver cancer Neg Hx    Stomach cancer Neg Hx      Social History   Socioeconomic History   Marital status: Married    Spouse name:  Not on file   Number of children: 5   Years of education: Not on file   Highest education level: Not on file  Occupational History   Not on file  Tobacco Use   Smoking status: Never   Smokeless tobacco: Never  Vaping Use   Vaping Use: Never used  Substance and Sexual Activity   Alcohol use: Not Currently    Alcohol/week: 1.0 - 2.0 standard drink    Types: 1 - 2 Cans of beer per week   Drug use: No   Sexual activity: Yes  Other Topics Concern   Not on file  Social History Narrative   Married, 5 children.   Orig from [place], went to college in [place].  Has one sister-healthy.   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   Exercises 5 days a week (runs/lifts wts).     No T/A/Ds.   Social Determinants of Health   Financial Resource Strain: Not on file  Food Insecurity: Not on file  Transportation Needs: Not on file  Physical Activity: Not on file  Stress: Not on file  Social Connections: Not on file  Intimate Partner Violence: Not on file     Allergies  Allergen Reactions   Food     Green Peppers: GI upset     Outpatient Medications Prior to Visit  Medication Sig Dispense Refill   albuterol (VENTOLIN HFA) 108 (90 Base) MCG/ACT inhaler Inhale 1 puff into the lungs every 6 (six) hours as needed for wheezing or shortness of breath.     Cholecalciferol (VITAMIN D3 ADULT GUMMIES PO) Take by mouth.     CVS VITAMIN C 1000 MG tablet Take 2,000 mg by mouth daily.     fluticasone (FLOVENT HFA) 110 MCG/ACT inhaler Inhale into the lungs. Inhale on puff BID     LORazepam (ATIVAN) 1 MG tablet 1-2 tabs po bid prn anxiety and/or insomnia 90 tablet 0   pantoprazole (PROTONIX) 40 MG tablet Take 1 tablet (40 mg total) by mouth 2 (two) times daily. 60 tablet 3   Spacer/Aero-Holding Chambers (OPTICHAMBER DIAMOND-LG MASK) DEVI See admin instructions.     traMADol (ULTRAM) 50 MG tablet 1-2 tabs po qd as needed for a persistent headache 15 tablet 0   pseudoephedrine  (SUDAFED) 30 MG tablet      Triamcinolone Acetonide (NASACORT ALLERGY 24HR NA) Place into the nose as directed.     No facility-administered medications prior to visit.    Review of Systems  Constitutional:  Negative for chills, diaphoresis, fever, malaise/fatigue and weight loss.  HENT:  Negative for congestion, ear pain, nosebleeds, sinus pain and sore throat.   Eyes:  Negative for blurred vision.  Respiratory:  Negative for cough, hemoptysis, sputum production, shortness of breath and wheezing.   Cardiovascular:  Negative for chest pain, palpitations, orthopnea, claudication, leg swelling and PND.  Gastrointestinal:  Positive  for abdominal pain. Negative for blood in stool, constipation, diarrhea, heartburn, melena, nausea and vomiting.       Bloating with meals  Genitourinary:  Negative for hematuria.  Musculoskeletal:  Negative for joint pain and myalgias.  Skin:  Negative for itching and rash.  Neurological:  Negative for dizziness and weakness.  Endo/Heme/Allergies:  Does not bruise/bleed easily.   Objective:   Vitals:   06/25/21 1501  BP: 114/72  Pulse: 71  Temp: 97.8 F (36.6 C)  SpO2: 99%  Weight: 216 lb 9.6 oz (98.2 kg)  Height: 6' 2"  (1.88 m)   Physical Exam Constitutional:      Appearance: Normal appearance. He is normal weight.  HENT:     Head: Normocephalic and atraumatic.  Eyes:     General: No scleral icterus.    Extraocular Movements: Extraocular movements intact.     Conjunctiva/sclera: Conjunctivae normal.     Pupils: Pupils are equal, round, and reactive to light.  Cardiovascular:     Rate and Rhythm: Normal rate and regular rhythm.     Pulses: Normal pulses.     Heart sounds: Normal heart sounds. No murmur heard. Pulmonary:     Effort: Pulmonary effort is normal. No respiratory distress.     Breath sounds: Decreased air movement present. No wheezing or rhonchi.  Musculoskeletal:     Right lower leg: No edema.     Left lower leg: No edema.   Neurological:     General: No focal deficit present.     Mental Status: He is alert and oriented to person, place, and time. Mental status is at baseline.    CBC    Component Value Date/Time   WBC 6.3 05/24/2021 1309   RBC 4.96 05/24/2021 1309   HGB 14.7 05/24/2021 1309   HCT 43.5 05/24/2021 1309   PLT 242.0 05/24/2021 1309   MCV 87.7 05/24/2021 1309   MCH 29.6 01/29/2012 0848   MCHC 33.8 05/24/2021 1309   RDW 13.5 05/24/2021 1309   LYMPHSABS 1.2 05/24/2021 1309   MONOABS 0.4 05/24/2021 1309   EOSABS 0.1 05/24/2021 1309   BASOSABS 0.0 05/24/2021 1309   BMP Latest Ref Rng & Units 05/24/2021 03/01/2021 08/03/2020  Glucose 70 - 99 mg/dL 115(H) 91 88  BUN 6 - 23 mg/dL 13 16 15   Creatinine 0.40 - 1.50 mg/dL 0.95 0.99 1.00  Sodium 135 - 145 mEq/L 139 141 141  Potassium 3.5 - 5.1 mEq/L 4.3 4.5 4.1  Chloride 96 - 112 mEq/L 102 103 106  CO2 19 - 32 mEq/L 26 24 26   Calcium 8.4 - 10.5 mg/dL 10.0 9.8 9.9   Chest imaging: CXR 04/04/21 Diffuse interstitial prominence, nonspecific.  No acute abnormality  CTA Chest 06/17/20 Cardiovascular: Heart is normal size. Thoracic aorta is normal. Takeoff of the great vessels is unremarkable. Pulmonary arterial system is well opacified without evidence of emboli. Remaining vascular structures are unremarkable.  Mediastinum/Nodes: No significant mediastinal or hilar adenopathy. Remaining mediastinal structures are normal.  Lungs/Pleura: Lungs are adequately inflated and demonstrate patchy bilateral nodular airspace process likely multifocal pneumonia. No effusion. Airways are normal.  Upper Abdomen: No acute findings. Previous cholecystectomy.  Musculoskeletal: Mild loss of anterior vertebral body height of 2 adjacent midthoracic vertebral bodies.  06/25/20 CXR Cardiovascular: Cardiac silhouette and pulmonary vasculature are within normal limits.  Mediastinum: Within normal limits.  Lungs/pleura: No focal pulmonary opacities. No pleural  effusion. No pneumothorax.  Upper abdomen: Visualized portions are unremarkable.  Chest wall/osseous structures: Unremarkable.  Labs: 06/25/20 BMP: Na 138, K 3.8, Cl 103, CO2 26, BUN 16 Cr 0.85, Ca 9.7 CBC: WBC 10.2, Hgb 14.7, Hct 43.7, Plt 257 An ABG on 06/23/20 showed pH 7.756, PCO 12.8, PO2 122.     Assessment & Plan:   Shortness of breath  Discussion: Henry [name] is a 50 year old [name], non-smoker with recurrent shortness of breath.  His shortness of breath appears to be resolved at this time and he is having more issues from a gastrointestinal standpoint.   We discussed weaning off his flovent inhaler in case this could be contributing to his abnormal liver function tests. Given his lack of respiratory symptoms this medication can be stopped and used on a PRN basis.   His HRCT chest and pulmonary function tests are normal.   Follow up as needed.  Freda Jackson, MD [facility] Office: [phone]    Current Outpatient Medications:    albuterol (VENTOLIN HFA) 108 (90 Base) MCG/ACT inhaler, Inhale 1 puff into the lungs every 6 (six) hours as needed for wheezing or shortness of breath., Disp: , Rfl:    Cholecalciferol (VITAMIN D3 ADULT GUMMIES PO), Take by mouth., Disp: , Rfl:    CVS VITAMIN C 1000 MG tablet, Take 2,000 mg by mouth daily., Disp: , Rfl:    fluticasone (FLOVENT HFA) 110 MCG/ACT inhaler, Inhale into the lungs. Inhale on puff BID, Disp: , Rfl:    LORazepam (ATIVAN) 1 MG tablet, 1-2 tabs po bid prn anxiety and/or insomnia, Disp: 90 tablet, Rfl: 0   pantoprazole (PROTONIX) 40 MG tablet, Take 1 tablet (40 mg total) by mouth 2 (two) times daily., Disp: 60 tablet, Rfl: 3   Spacer/Aero-Holding Chambers (OPTICHAMBER DIAMOND-LG MASK) DEVI, See admin instructions., Disp: , Rfl:    traMADol (ULTRAM) 50 MG tablet, 1-2 tabs po qd as needed for a persistent headache, Disp: 15 tablet, Rfl: 0

## 2021-06-26 ENCOUNTER — Encounter: Payer: Self-pay | Admitting: Pulmonary Disease

## 2021-06-26 DIAGNOSIS — R748 Abnormal levels of other serum enzymes: Secondary | ICD-10-CM | POA: Diagnosis not present

## 2021-06-26 DIAGNOSIS — Z800 Family history of malignant neoplasm of digestive organs: Secondary | ICD-10-CM | POA: Diagnosis not present

## 2021-06-26 DIAGNOSIS — Z9049 Acquired absence of other specified parts of digestive tract: Secondary | ICD-10-CM | POA: Diagnosis not present

## 2021-06-26 DIAGNOSIS — R1013 Epigastric pain: Secondary | ICD-10-CM | POA: Diagnosis not present

## 2021-06-26 NOTE — Telephone Encounter (Signed)
Request to [facility] for labs, pt was also seen by Lavena Bullion, DO on 06/18/21. Recent OV note printed and placed on your desk

## 2021-06-26 NOTE — Telephone Encounter (Signed)
Britt:  Pls obtain all lab test results from [facility]. Also, get name of the second GI MD/office so we can request there records.-thx

## 2021-06-27 ENCOUNTER — Ambulatory Visit: Payer: BC Managed Care – PPO | Admitting: Family Medicine

## 2021-06-27 ENCOUNTER — Other Ambulatory Visit: Payer: Self-pay

## 2021-06-27 ENCOUNTER — Encounter: Payer: Self-pay | Admitting: Family Medicine

## 2021-06-27 ENCOUNTER — Telehealth: Payer: Self-pay

## 2021-06-27 VITALS — BP 106/68 | HR 73 | Temp 97.5°F | Ht 74.0 in | Wt 216.0 lb

## 2021-06-27 DIAGNOSIS — R7401 Elevation of levels of liver transaminase levels: Secondary | ICD-10-CM | POA: Diagnosis not present

## 2021-06-27 DIAGNOSIS — R1013 Epigastric pain: Secondary | ICD-10-CM | POA: Diagnosis not present
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
## 2021-06-27 NOTE — Telephone Encounter (Signed)
Spoke with Clifton James, [facility] representative and hepatic panel cannot be added until labs picked by and received by [facility]. Test code [place] 10256 using dx code R74.01 Transaminitis. Provider has been made aware. Will call tomorrow and add. CB # is [phone], acct [phone]

## 2021-06-28 ENCOUNTER — Encounter: Payer: Self-pay | Admitting: Family Medicine

## 2021-06-28 MED ORDER — HYOSCYAMINE SULFATE 0.125 MG SL SUBL: 0.1250 mg | SUBLINGUAL_TABLET | Freq: Four times a day (QID) | SUBLINGUAL | 1 refills | Status: DC | PRN

## 2021-06-28 NOTE — Telephone Encounter (Signed)
FYI  Please see below

## 2021-06-28 NOTE — Telephone Encounter (Signed)
CT scan report reviewed.  Normal-appearing liver, pancreas, spleen, and visualized GI tract (aside from postoperative changes in the small bowel).  Gallbladder surgically absent.  While Sphincter of Oddi dysfunction can occur after cholecystectomy, is not typically the most common cause for GI symptoms.  With that said, certainly something to keep an eye on given the elevated liver enzymes.  If we do have continued concerns for SOD (type 2), [facility] treatment.  In the meantime, based on symptoms, I am still curious about possible SIBO/IMO and recommend breath testing.  Agree with continued dietary modifications to try to identify triggers.  Also plan for repeat liver enzymes in another 2 weeks.  Okay with follow-up appointment as currently scheduled which allows for continued diagnostic information.  I know the Bentyl was previously not efficacious, but if he has not already tried Levsin, this is worth a trial for diagnostic and therapeutic purposes. Can trial course of Levsin 0.125 mg every 6 hours as needed for abdominal pain.

## 2021-06-28 NOTE — Telephone Encounter (Signed)
Lab added

## 2021-07-01 LAB — MITOCHONDRIAL ANTIBODIES: Mitochondrial M2 Ab, IgG: <=20 U (ref <=20.0)

## 2021-07-01 LAB — HEPATIC FUNCTION PANEL

## 2021-07-01 LAB — ANTI-MICROSOMAL ANTIBODY LIVER / KIDNEY: LKM1 Ab: <=20 U (ref <=20.0)

## 2021-07-02 DIAGNOSIS — K634 Enteroptosis: Secondary | ICD-10-CM | POA: Diagnosis not present

## 2021-07-02 DIAGNOSIS — R630 Anorexia: Secondary | ICD-10-CM | POA: Diagnosis not present

## 2021-07-02 DIAGNOSIS — R140 Abdominal distension (gaseous): Secondary | ICD-10-CM | POA: Diagnosis not present

## 2021-07-02 DIAGNOSIS — R1084 Generalized abdominal pain: Secondary | ICD-10-CM | POA: Diagnosis not present

## 2021-07-02 NOTE — Telephone Encounter (Signed)
Correct, we continue to be in the data gathering phase trying to find source of symptomatology as well as the elevated enzymes.  Breath testing should help.  Please clarify if he needs the form signed before submitting.  I think with his significant dietary modifications but lack of improvement, it is more reasonable to actually start to broaden his diet.  Can consume 6 small meals/day, and use boost, Ensure, etc. to supplement 2 of those meals as needed to continue ingesting calorie rich foods.  I do not think the Levsin is the source of the shortness of breath, and would be worth continuing especially if GI symptoms are responsive to this.

## 2021-07-03 ENCOUNTER — Telehealth: Payer: Self-pay | Admitting: General Surgery

## 2021-07-03 DIAGNOSIS — R140 Abdominal distension (gaseous): Secondary | ICD-10-CM

## 2021-07-03 DIAGNOSIS — R1013 Epigastric pain: Secondary | ICD-10-CM

## 2021-07-03 DIAGNOSIS — R0602 Shortness of breath: Secondary | ICD-10-CM

## 2021-07-03 NOTE — Telephone Encounter (Signed)
-----   Message from Lavena Bullion, DO sent at 07/03/2021 12:54 PM EDT ----- Labs reviewed. Normal/negative AMA and anti-LKM. Does not appear that the total IgG and total IgM were performed. Please call lab to see if these can be added on to the sample collected. If not, plan to obtain next week at the time of repeat LFT panel.   If IgG and IgM are normal but enzymes still uptrending, will refer for liver biopsy.

## 2021-07-03 NOTE — Telephone Encounter (Signed)
Called the patient he will come to [facility] for labs. Stated he mailed the breath test today back to [facility]

## 2021-07-04 NOTE — Telephone Encounter (Signed)
Labs added patient is aware and will go tomorrow for lab work

## 2021-07-04 NOTE — Addendum Note (Signed)
Addended by: Curlene Labrum E on: 07/04/2021 04:36 PM   Modules accepted: Orders

## 2021-07-04 NOTE — Telephone Encounter (Addendum)
Patient wants to know if he can add an iron panel and vitamin B12 lab on to his lab work. Michela Pitcher that he has been having some dietary issues and noticed dark cycles under his eyes, having a upset stomach and nausea. He said he is concerned about having some type of deficiency possibly but is just asking if these 2 labs can be added on?  Please advise

## 2021-07-08 ENCOUNTER — Other Ambulatory Visit: Payer: Self-pay

## 2021-07-08 ENCOUNTER — Other Ambulatory Visit (INDEPENDENT_AMBULATORY_CARE_PROVIDER_SITE_OTHER): Payer: BC Managed Care – PPO

## 2021-07-08 DIAGNOSIS — R140 Abdominal distension (gaseous): Secondary | ICD-10-CM

## 2021-07-08 DIAGNOSIS — R1013 Epigastric pain: Secondary | ICD-10-CM

## 2021-07-08 DIAGNOSIS — R0602 Shortness of breath: Secondary | ICD-10-CM

## 2021-07-09 LAB — HEPATIC FUNCTION PANEL
ALT: 34 U/L (ref 0–53)
AST: 30 U/L (ref 0–37)
Albumin: 4.5 g/dL (ref 3.5–5.2)
Alkaline Phosphatase: 53 U/L (ref 39–117)
Bilirubin, Direct: 0.5 mg/dL — ABNORMAL HIGH (ref 0.0–0.3)
Total Bilirubin: 2.7 mg/dL — ABNORMAL HIGH (ref 0.2–1.2)
Total Protein: 6.5 g/dL (ref 6.0–8.3)

## 2021-07-09 LAB — FOLATE: Folate: 8.1 ng/mL (ref >5.9)

## 2021-07-09 LAB — IGG: IgG (Immunoglobin G), Serum: 824 mg/dL (ref 600–1640)

## 2021-07-09 LAB — IGM: IgM, Serum: 39 mg/dL — ABNORMAL LOW (ref 50–300)

## 2021-07-09 LAB — VITAMIN B12: Vitamin B-12: 209 pg/mL — ABNORMAL LOW (ref 211–911)

## 2021-07-09 NOTE — Telephone Encounter (Signed)
The feeling he described can sometimes be what is called globus sensation.  It is the feeling that something is caught and can be there independent of eating/drinking.  It is an atypical manifestation of reflux, and can be responsive to reflux medications/treatment.  Otherwise, glad to hear that some of the symptoms are improving.  Agree with trialing the low FODMAP diet for the next 4-6 weeks.  If symptoms continue to improve, that diet provides a good way for systematic reintroduction of foods into the diet.

## 2021-07-10 ENCOUNTER — Telehealth: Payer: Self-pay | Admitting: General Surgery

## 2021-07-10 DIAGNOSIS — E538 Deficiency of other specified B group vitamins: Secondary | ICD-10-CM

## 2021-07-10 DIAGNOSIS — R1013 Epigastric pain: Secondary | ICD-10-CM

## 2021-07-10 DIAGNOSIS — K5900 Constipation, unspecified: Secondary | ICD-10-CM

## 2021-07-10 DIAGNOSIS — R768 Other specified abnormal immunological findings in serum: Secondary | ICD-10-CM

## 2021-07-10 NOTE — Telephone Encounter (Signed)
-----   Message from Warren, DO sent at 07/10/2021  3:08 PM EDT ----- Henry [name] news, the liver enzymes (AST and ALT) have now normalized.  Elevated T bili consistent with benign Gilbert's syndrome which requires no further work-up.  Vitamin B12 is low at 209 and plan to treat as below.  Otherwise normal folate, IgG and no elevated IgM.  - B12 1000 mcg PO daily - Repeat Vitamin B12 level 3 months after starting therapy to monitor for effect

## 2021-07-10 NOTE — Telephone Encounter (Signed)
Contacted the patient and went over his results verbatim. The patient verbalized understanding and stated he is still having the same symptoms and is waiting on his breath test results. He is also a bit concerned  that his IgM is low? Pt will start on 1041mg daily and RTC in 3 months for labs. Labs ordered today.

## 2021-07-11 ENCOUNTER — Other Ambulatory Visit: Payer: Self-pay

## 2021-07-11 NOTE — Telephone Encounter (Signed)
Contacted the patient regarding his breath test and IgM results. Explained to him we are willing to send a referral for a second opinion to Allergy/Immunology. The patient agreed. Referral placed.

## 2021-07-12 ENCOUNTER — Ambulatory Visit: Payer: BC Managed Care – PPO | Admitting: Family Medicine

## 2021-07-12 ENCOUNTER — Encounter: Payer: Self-pay | Admitting: Family Medicine

## 2021-07-12 VITALS — BP 97/65 | HR 80 | Temp 97.4°F | Ht 74.0 in | Wt 205.4 lb

## 2021-07-12 DIAGNOSIS — R634 Abnormal weight loss: Secondary | ICD-10-CM

## 2021-07-12 DIAGNOSIS — R5383 Other fatigue: Secondary | ICD-10-CM | POA: Diagnosis not present

## 2021-07-12 DIAGNOSIS — R768 Other specified abnormal immunological findings in serum: Secondary | ICD-10-CM | POA: Diagnosis not present

## 2021-07-12 DIAGNOSIS — R7401 Elevation of levels of liver transaminase levels: Secondary | ICD-10-CM | POA: Diagnosis not present

## 2021-07-12 DIAGNOSIS — R109 Unspecified abdominal pain: Secondary | ICD-10-CM

## 2021-07-12 LAB — POCT URINALYSIS DIPSTICK
Bilirubin, UA: NEGATIVE
Blood, UA: NEGATIVE
Glucose, UA: NEGATIVE
Ketones, UA: NEGATIVE
Leukocytes, UA: NEGATIVE
Nitrite, UA: NEGATIVE
Protein, UA: NEGATIVE
Spec Grav, UA: 1.015 (ref 1.010–1.025)
Urobilinogen, UA: 0.2 E.U./dL
pH, UA: 5.5 (ref 5.0–8.0)
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
## 2021-07-15 ENCOUNTER — Other Ambulatory Visit: Payer: Self-pay

## 2021-07-15 ENCOUNTER — Ambulatory Visit (INDEPENDENT_AMBULATORY_CARE_PROVIDER_SITE_OTHER): Payer: BC Managed Care – PPO

## 2021-07-15 DIAGNOSIS — R768 Other specified abnormal immunological findings in serum: Secondary | ICD-10-CM

## 2021-07-15 DIAGNOSIS — R634 Abnormal weight loss: Secondary | ICD-10-CM

## 2021-07-15 DIAGNOSIS — R5383 Other fatigue: Secondary | ICD-10-CM | POA: Diagnosis not present

## 2021-07-15 DIAGNOSIS — R7401 Elevation of levels of liver transaminase levels: Secondary | ICD-10-CM

## 2021-07-15 DIAGNOSIS — R109 Unspecified abdominal pain: Secondary | ICD-10-CM

## 2021-07-17 ENCOUNTER — Telehealth: Payer: Self-pay | Admitting: General Surgery

## 2021-07-17 NOTE — Telephone Encounter (Signed)
Sent SIBO test results to patient per pt request.

## 2021-07-18 LAB — ALPHA-GAL PANEL
Beef IgE: <0.1 kU/L (ref <0.35)
Class: 0
Class: 0
Class: 0
Galactose-alpha-1,3-galactose IgE: <0.1 kU/L (ref <0.10)
LAMB/MUTTON IGE: <0.1 kU/L (ref <0.35)
Pork IgE: <0.1 kU/L (ref <0.35)

## 2021-07-18 LAB — COMPREHENSIVE METABOLIC PANEL
AG Ratio: 2.4 (calc) (ref 1.0–2.5)
ALT: 50 U/L — ABNORMAL HIGH (ref 9–46)
AST: 25 U/L (ref 10–40)
Albumin: 4.7 g/dL (ref 3.6–5.1)
Alkaline phosphatase (APISO): 60 U/L (ref 36–130)
BUN: 16 mg/dL (ref 7–25)
CO2: 29 mmol/L (ref 20–32)
Calcium: 10.3 mg/dL (ref 8.6–10.3)
Chloride: 101 mmol/L (ref 98–110)
Creat: 0.95 mg/dL (ref 0.60–1.29)
Globulin: 2 g/dL (calc) (ref 1.9–3.7)
Glucose, Bld: 98 mg/dL (ref 65–99)
Potassium: 4.2 mmol/L (ref 3.5–5.3)
Sodium: 138 mmol/L (ref 135–146)
Total Bilirubin: 2.9 mg/dL — ABNORMAL HIGH (ref 0.2–1.2)
Total Protein: 6.7 g/dL (ref 6.1–8.1)

## 2021-07-18 LAB — TISSUE TRANSGLUTAMINASE, IGA: (tTG) Ab, IgA: <1 U/mL

## 2021-07-18 LAB — IGA: Immunoglobulin A: 175 mg/dL (ref 47–310)

## 2021-07-25 DIAGNOSIS — R000 Tachycardia, unspecified: Secondary | ICD-10-CM | POA: Insufficient documentation

## 2021-07-25 DIAGNOSIS — J3089 Other allergic rhinitis: Secondary | ICD-10-CM | POA: Diagnosis not present

## 2021-07-25 DIAGNOSIS — J4530 Mild persistent asthma, uncomplicated: Secondary | ICD-10-CM | POA: Diagnosis not present

## 2021-07-25 DIAGNOSIS — B349 Viral infection, unspecified: Secondary | ICD-10-CM | POA: Diagnosis not present

## 2021-07-25 DIAGNOSIS — K9049 Malabsorption due to intolerance, not elsewhere classified: Secondary | ICD-10-CM | POA: Diagnosis not present

## 2021-07-31 DIAGNOSIS — R0600 Dyspnea, unspecified: Secondary | ICD-10-CM | POA: Diagnosis not present

## 2021-07-31 DIAGNOSIS — G479 Sleep disorder, unspecified: Secondary | ICD-10-CM | POA: Diagnosis not present

## 2021-07-31 DIAGNOSIS — B948 Sequelae of other specified infectious and parasitic diseases: Secondary | ICD-10-CM | POA: Diagnosis not present

## 2021-07-31 DIAGNOSIS — U099 Post covid-19 condition, unspecified: Secondary | ICD-10-CM | POA: Diagnosis not present

## 2021-08-01 ENCOUNTER — Other Ambulatory Visit: Payer: Self-pay

## 2021-08-01 ENCOUNTER — Encounter: Payer: Self-pay | Admitting: Gastroenterology

## 2021-08-01 ENCOUNTER — Ambulatory Visit (INDEPENDENT_AMBULATORY_CARE_PROVIDER_SITE_OTHER): Payer: BC Managed Care – PPO | Admitting: Gastroenterology

## 2021-08-01 VITALS — BP 122/78 | HR 65 | Ht 74.0 in | Wt 202.0 lb

## 2021-08-01 DIAGNOSIS — R140 Abdominal distension (gaseous): Secondary | ICD-10-CM

## 2021-08-01 DIAGNOSIS — R0602 Shortness of breath: Secondary | ICD-10-CM

## 2021-08-01 DIAGNOSIS — M940 Chondrocostal junction syndrome [Tietze]: Secondary | ICD-10-CM

## 2021-08-01 DIAGNOSIS — R7401 Elevation of levels of liver transaminase levels: Secondary | ICD-10-CM

## 2021-08-01 DIAGNOSIS — R634 Abnormal weight loss: Secondary | ICD-10-CM

## 2021-08-01 DIAGNOSIS — K219 Gastro-esophageal reflux disease without esophagitis: Secondary | ICD-10-CM

## 2021-08-01 DIAGNOSIS — Z800 Family history of malignant neoplasm of digestive organs: Secondary | ICD-10-CM

## 2021-08-01 NOTE — Patient Instructions (Addendum)
If you are age 50 or older, your body mass index should be between 23-30. Your Body mass index is 25.94 kg/m. If this is out of the aforementioned range listed, please consider follow up with your Primary Care Provider.  If you are age 68 or younger, your body mass index should be between 19-25. Your Body mass index is 25.94 kg/m. If this is out of the aformentioned range listed, please consider follow up with your Primary Care Provider.   __________________________________________________________  The [facility] providers would like to encourage you to use [facility] to communicate with providers for non-urgent requests or questions.  Due to long hold times on the telephone, sending your provider a message by [employer] may be a faster and more efficient way to get a response.  Please allow 48 business hours for a response.  Please remember that this is for non-urgent requests.   Due to recent changes in healthcare laws, you may see the results of your imaging and laboratory studies on MyChart before your provider has had a chance to review them.  We understand that in some cases there may be results that are confusing or concerning to you. Not all laboratory results come back in the same time frame and the provider may be waiting for multiple results in order to interpret others.  Please give [place] 48 hours in order for your provider to thoroughly review all the results before contacting the office for clarification of your results.    We have placed a referral for a consultation with Rhuematology. They will contact you with an appointment.  Thank you for choosing me and [facility].  Vito Cirigliano, D.O.

## 2021-08-01 NOTE — Progress Notes (Signed)
Chief Complaint:    Elevated liver enzymes, reflux symptoms  GI History: 50 year old male with recently diagnosed long COVID,, Gilberts Syndrome, initially seen in the GI clinic in 05/2021 for evaluation postprandial bloating, and MEG pain with associated SOB, 30 pound weight loss (due to decreased meal size to try to limit symptoms).  Was initially evaluated in [facility] in 05/2021 as outlined below.  - Has had elevated T bili with normal direct bilirubin dating back to 2009.  AST/ALT generally normal from 01/2012-02/2021.  - Labs 03/01/2021: AST/ALT 21/32, T bili 1.2, normal ALP - Cologuard 03/2021: Negative - CT chest 05/20/2021: Normal - Labs 05/24/2021: T bili 2.2, AST/ALT 31/64, normal ALP.  Otherwise normal BMP.  Normal CBC, lipase.  Negative H. pylori IgG - Labs 05/29/2021: T bili 2.6,Dbili 0.4.  AST/ALT 52/102.  Normal ALP, albumin - 06/04/2021: Initial GI appoint with Dr. Willa Rough at [place].  Started Bentyl and referred for EGD and MRCP. - Labs 06/06/2021: AST/ALT 53/116, T bili 2.3.  Normal ALP - EGD 06/06/2021: Normal - MRCP 06/07/2021: Normal - 05/2021: CT triphase with Pancreas protocol normal - Has had improvement with simethicone - 06/13/2021: f/u in GI Clinic: No change with dicyclomine or PPI.  Dicyclomine stopped and PPI reduced.  Recommended Boost or Ensure to increase caloric intake with plan to follow-up in about 6 weeks. - 06/18/2021: Initial appointment in [facility] - 06/27/2021: Normal/negative AMA, anti-L KM, IgG, IgA, TTG.  IgM 39 - 07/08/2021: AST/ALT 30/34, T bili 2.7,Dbili 0.5, normal ALP.  B12 209, normal folate - 06/2021: Normal/negative breath testing for SIBO   Family history notable for mother with Pancreatic Cancer; diagnosed 10-08-20 at age 9; deceased 53 weeks later. No other known Fhx of Pancreatic CA.    HPI:     Patient is a 50 y.o. male presenting to the Gastroenterology Clinic for follow-up.  Was initially seen by me on 06/18/2021 for evaluation of abdominal  pain, reflux symptoms, and elevated liver enzymes.  Has had an extensive, largely unremarkable work-up as outlined above.  Has since stopped all meds and started low FODMAP diet.  Recommended trial of FD guard.  Due to mildly reduced IgM, patient requested referral to Allergy/Immunology. Did do skin prick testing which was normal/unremarkable per patient. No further w/u needed. Also did PFTs which were abnormal and restarted Flovent.   Was seen and followed by his PCM on 07/12/2021.    Was seen in the [facility] post-COVID recovery clinic yesterday and per patient did NOT feel sxs were c/w long COVID.  Did recommend multiple different breathing exercises, to include diaphragmatic breathing.  Today, his main issue is sternal and xiphoid type pain.  He attributes the symptoms to reflux, but not necessarily any component of regurgitation or waterbrash. Builds through the day, then resolves by AM. Peak in evening.  Pain can radiate through and around to the back.  Otherwise, Sxs not as pronounced recently. Pain tends to be more so when fasting, so he tends to eat multiple small meals. Only tried Levsin twice, then stopped.   Otherwise, weight stable. Hoping to start to liberalize diet.   Still walking 7-10 miles/day.   Review of systems:     No chest pain, no SOB, no fevers, no urinary sx   Past Medical History:  Diagnosis Date   Adjustment disorder with mixed emotional features    Atypical chest pain 02/2019   Colon cancer screening 03/2021   Cologuard neg 03/2021->rpt 3 yrs.   COVID-19  virus infection 05/2020   Rosanna Randy disease    Insomnia    Lateral malleolar fracture 09/2020   RIGHT (closed, nondisplaced)   Migraine syndrome    Palpitations    02/2021 Zio patch --No arrythmia   Peyronie's disease 01/2013   Urology: Vit E 400 U/day   Pneumonia    Retinal artery occlusion 01/2012   Cardiac event monitor neg; carotid dopplers and transthoracic echo normal.  MRI brain with subtle  abnormalities--?Susac's syndrome. (hearing loss + BRAO)    Patient's surgical history, family medical history, social history, medications and allergies were all reviewed in Epic    Current Outpatient Medications  Medication Sig Dispense Refill   albuterol (VENTOLIN HFA) 108 (90 Base) MCG/ACT inhaler Inhale 1 puff into the lungs every 6 (six) hours as needed for wheezing or shortness of breath. (Patient not taking: No sig reported)     cyanocobalamin 100 MCG tablet Take 100 mcg by mouth daily.     fluticasone (FLOVENT HFA) 110 MCG/ACT inhaler Inhale into the lungs. Inhale on puff BID (Patient not taking: No sig reported)     hyoscyamine (LEVSIN SL) 0.125 MG SL tablet Place 1 tablet (0.125 mg total) under the tongue every 6 (six) hours as needed. (Patient not taking: Reported on 07/12/2021) 45 tablet 1   LORazepam (ATIVAN) 1 MG tablet 1-2 tabs po bid prn anxiety and/or insomnia (Patient not taking: Reported on 07/12/2021) 90 tablet 0   Spacer/Aero-Holding Chambers (OPTICHAMBER DIAMOND-LG MASK) DEVI See admin instructions. (Patient not taking: No sig reported)     traMADol (ULTRAM) 50 MG tablet 1-2 tabs po qd as needed for a persistent headache (Patient not taking: No sig reported) 15 tablet 0   No current facility-administered medications for this visit.    Physical Exam:     There were no vitals taken for this visit.  GENERAL:  Pleasant male in NAD PSYCH: : Cooperative, normal affect CARDIAC:  RRR, no murmur heard, no peripheral edema PULM: Normal respiratory effort, lungs CTA bilaterally, no wheezing ABDOMEN:  Nondistended, soft, nontender. No obvious masses, no hepatomegaly,  normal bowel sounds SKIN:  turgor, no lesions seen Musculoskeletal: TTP along inferior rib cage and lower sternal border/xiphoid process.  TTP at costochondral margins.  Normal muscle tone, normal strength NEURO: Alert and oriented x 3, no focal neurologic deficits   IMPRESSION and PLAN:    1)  Musculoskeletal pain/Costochondritis - His symptoms seem more consistent with MSK type pain.  While he has previously said this is reflux, there is no heartburn or regurgitation component to it and no change with previous trial of meds.  Exam today more consistent with MSK pain.  Discussed DDx and other potential etiologies. - Rheumatology referral for evaluation.  Also consider Sports Medicine referral - If plan for short course of high-dose NSAIDs and/or steroids, recommended low-dose PPI for gastric prophylaxis  2) Elevated liver enzymes - Resolved  3) Gilberts Syndrome - No further evaluation needed  4) Weight loss 5) Abdominal bloating - Weight now stable.  Bloating much improved - Start liberalizing diet systematically via low FODMAP - Continue active lifestyle  6) Family history of pancreatic cancer - Mother was diagnosed with pancreatic cancer in her 12s, deceased earlier this year - CT pancreas protocol previously completed without concerning findings - Previously discussed Pancreatic Cancer screening protocols as outlined by [employer] and [employer] consortium  7) Shortness of breath - Continue follow-up in the Pulmonary clinic - Curious to what degree MSK could be playing a role  RTC as needed  I spent 35 minutes of time, including in depth chart review, independent review of results as outlined above, communicating results with the patient directly, face-to-face time with the patient, coordinating care, and ordering studies and medications as appropriate, and documentation.       [place] ,DO, FACG 08/01/2021, 1:16 PM

## 2021-08-14 ENCOUNTER — Ambulatory Visit: Payer: BC Managed Care – PPO | Admitting: Gastroenterology

## 2021-09-12 ENCOUNTER — Ambulatory Visit: Payer: Self-pay | Admitting: Allergy
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
## 2021-10-03 DIAGNOSIS — J4530 Mild persistent asthma, uncomplicated: Secondary | ICD-10-CM | POA: Diagnosis not present

## 2021-10-03 DIAGNOSIS — M546 Pain in thoracic spine: Secondary | ICD-10-CM | POA: Diagnosis not present

## 2021-10-03 DIAGNOSIS — H6990 Unspecified Eustachian tube disorder, unspecified ear: Secondary | ICD-10-CM | POA: Diagnosis not present

## 2021-10-04 ENCOUNTER — Other Ambulatory Visit: Payer: Self-pay

## 2021-10-04 ENCOUNTER — Ambulatory Visit: Payer: BC Managed Care – PPO | Admitting: Internal Medicine

## 2021-10-04 ENCOUNTER — Encounter: Payer: Self-pay | Admitting: Internal Medicine

## 2021-10-04 VITALS — BP 116/70 | HR 68 | Ht 74.0 in | Wt 202.0 lb

## 2021-10-04 DIAGNOSIS — R002 Palpitations: Secondary | ICD-10-CM

## 2021-10-04 NOTE — Patient Instructions (Signed)
Medication Instructions:  Your physician recommends that you continue on your current medications as directed. Please refer to the Current Medication list given to you today.  *If you need a refill on your cardiac medications before your next appointment, please call your pharmacy*   Lab Work: NONE If you have labs (blood work) drawn today and your tests are completely normal, you will receive your results only by: [place] (if you have MyChart) OR A paper copy in the mail If you have any lab test that is abnormal or we need to change your treatment, we will call you to review the results.   Testing/Procedures: NONE   Follow-Up: As needed At [facility], you and your health needs are our priority.  As part of our continuing mission to provide you with exceptional heart care, we have created designated Provider Care Teams.  These Care Teams include your primary Cardiologist (physician) and Advanced Practice Providers (APPs -  Physician Assistants and Nurse Practitioners) who all work together to provide you with the care you need, when you need it.   Provider:   Werner Lean, MD

## 2021-10-07 ENCOUNTER — Encounter: Payer: Self-pay | Admitting: Gastroenterology

## 2021-11-07 DIAGNOSIS — J3089 Other allergic rhinitis: Secondary | ICD-10-CM | POA: Diagnosis not present

## 2021-11-07 DIAGNOSIS — J4530 Mild persistent asthma, uncomplicated: Secondary | ICD-10-CM | POA: Diagnosis not present

## 2021-11-07 DIAGNOSIS — H6991 Unspecified Eustachian tube disorder, right ear: Secondary | ICD-10-CM | POA: Diagnosis not present

## 2022-02-03 DIAGNOSIS — J4530 Mild persistent asthma, uncomplicated: Secondary | ICD-10-CM | POA: Diagnosis not present

## 2022-02-03 DIAGNOSIS — H1013 Acute atopic conjunctivitis, bilateral: Secondary | ICD-10-CM | POA: Diagnosis not present

## 2022-02-03 DIAGNOSIS — J3089 Other allergic rhinitis: Secondary | ICD-10-CM | POA: Diagnosis not present

## 2022-02-06 ENCOUNTER — Other Ambulatory Visit: Payer: Self-pay | Admitting: Family Medicine

## 2022-03-11 ENCOUNTER — Ambulatory Visit: Payer: BC Managed Care – PPO | Admitting: Family Medicine

## 2022-03-11 VITALS — BP 107/70 | HR 66 | Temp 97.4°F | Ht 74.0 in | Wt 220.2 lb

## 2022-03-11 DIAGNOSIS — J019 Acute sinusitis, unspecified: Secondary | ICD-10-CM | POA: Diagnosis not present

## 2022-03-11 DIAGNOSIS — R002 Palpitations: Secondary | ICD-10-CM

## 2022-03-11 DIAGNOSIS — E538 Deficiency of other specified B group vitamins: Secondary | ICD-10-CM | POA: Diagnosis not present

## 2022-03-11 DIAGNOSIS — R7401 Elevation of levels of liver transaminase levels: Secondary | ICD-10-CM

## 2022-03-11 DIAGNOSIS — K760 Fatty (change of) liver, not elsewhere classified: Secondary | ICD-10-CM

## 2022-03-11 DIAGNOSIS — Z125 Encounter for screening for malignant neoplasm of prostate: Secondary | ICD-10-CM

## 2022-03-11 LAB — COMPREHENSIVE METABOLIC PANEL
ALT: 62 U/L — ABNORMAL HIGH (ref 0–53)
AST: 36 U/L (ref 0–37)
Albumin: 4.3 g/dL (ref 3.5–5.2)
Alkaline Phosphatase: 54 U/L (ref 39–117)
BUN: 21 mg/dL (ref 6–23)
CO2: 32 mEq/L (ref 19–32)
Calcium: 10 mg/dL (ref 8.4–10.5)
Chloride: 101 mEq/L (ref 96–112)
Creatinine, Ser: 0.87 mg/dL (ref 0.40–1.50)
GFR: 100.61 mL/min (ref >60.00)
Glucose, Bld: 94 mg/dL (ref 70–99)
Potassium: 4.1 mEq/L (ref 3.5–5.1)
Sodium: 140 mEq/L (ref 135–145)
Total Bilirubin: 1.7 mg/dL — ABNORMAL HIGH (ref 0.2–1.2)
Total Protein: 6.5 g/dL (ref 6.0–8.3)

## 2022-03-11 LAB — CBC
HCT: 44.8 % (ref 39.0–52.0)
Hemoglobin: 14.9 g/dL (ref 13.0–17.0)
MCHC: 33.4 g/dL (ref 30.0–36.0)
MCV: 88.2 fl (ref 78.0–100.0)
Platelets: 216 10*3/uL (ref 150.0–400.0)
RBC: 5.08 Mil/uL (ref 4.22–5.81)
RDW: 14.1 % (ref 11.5–15.5)
WBC: 8.2 10*3/uL (ref 4.0–10.5)

## 2022-03-11 LAB — TSH: TSH: 2.36 u[IU]/mL (ref 0.35–5.50)

## 2022-03-11 LAB — PSA: PSA: 0.35 ng/mL (ref 0.10–4.00)

## 2022-03-11 LAB — VITAMIN B12: Vitamin B-12: 508 pg/mL (ref 211–911)

## 2022-03-11 LAB — MAGNESIUM: Magnesium: 2.1 mg/dL (ref 1.5–2.5)

## 2022-03-11 MED ORDER — AMOXICILLIN 875 MG PO TABS: 875.0000 mg | ORAL_TABLET | Freq: Two times a day (BID) | ORAL | 0 refills | Status: AC

## 2022-03-11 NOTE — Addendum Note (Signed)
Addended by: Beryle Lathe S on: 03/11/2022 12:29 PM   Modules accepted: Orders
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
## 2022-06-09 ENCOUNTER — Other Ambulatory Visit: Payer: Self-pay | Admitting: Family Medicine

## 2022-06-09 ENCOUNTER — Encounter: Payer: Self-pay | Admitting: Family Medicine

## 2022-06-09 ENCOUNTER — Ambulatory Visit: Payer: BC Managed Care – PPO | Admitting: Family Medicine

## 2022-06-09 VITALS — BP 109/74 | HR 76 | Temp 97.8°F | Ht 74.0 in | Wt 227.6 lb

## 2022-06-09 DIAGNOSIS — K2100 Gastro-esophageal reflux disease with esophagitis, without bleeding: Secondary | ICD-10-CM | POA: Diagnosis not present

## 2022-06-09 DIAGNOSIS — F4322 Adjustment disorder with anxiety: Secondary | ICD-10-CM | POA: Diagnosis not present

## 2022-06-09 MED ORDER — XHANCE 93 MCG/ACT NA EXHU: 2.0000 | INHALANT_SUSPENSION | Freq: Every day | NASAL | 1 refills | Status: DC

## 2022-06-09 MED ORDER — OMEPRAZOLE 40 MG PO CPDR: DELAYED_RELEASE_CAPSULE | ORAL | 1 refills | Status: DC

## 2022-06-09 MED ORDER — LORAZEPAM 1 MG PO TABS
ORAL_TABLET | ORAL | 0 refills | Status: DC
Start: 2022-06-09 — End: 2023-03-17
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
## 2022-06-10 ENCOUNTER — Encounter: Payer: Self-pay | Admitting: Family Medicine

## 2022-06-30 DIAGNOSIS — H6503 Acute serous otitis media, bilateral: Secondary | ICD-10-CM | POA: Diagnosis not present

## 2022-07-01 ENCOUNTER — Other Ambulatory Visit: Payer: Self-pay | Admitting: Family Medicine

## 2022-07-07 ENCOUNTER — Ambulatory Visit: Payer: BC Managed Care – PPO | Admitting: Family Medicine

## 2022-07-07 ENCOUNTER — Encounter: Payer: Self-pay | Admitting: Family Medicine

## 2022-07-07 VITALS — BP 108/71 | HR 69 | Temp 97.8°F | Ht 74.0 in | Wt 225.4 lb

## 2022-07-07 DIAGNOSIS — R1010 Upper abdominal pain, unspecified: Secondary | ICD-10-CM | POA: Diagnosis not present

## 2022-07-07 DIAGNOSIS — K219 Gastro-esophageal reflux disease without esophagitis: Secondary | ICD-10-CM | POA: Diagnosis not present

## 2022-07-07 DIAGNOSIS — Z23 Encounter for immunization: Secondary | ICD-10-CM

## 2022-07-07 DIAGNOSIS — R0789 Other chest pain: Secondary | ICD-10-CM | POA: Diagnosis not present
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
## 2022-07-22 ENCOUNTER — Ambulatory Visit: Payer: BC Managed Care – PPO | Admitting: Family Medicine

## 2022-07-22 VITALS — BP 108/76 | HR 69 | Ht 74.0 in | Wt 219.0 lb

## 2022-07-22 DIAGNOSIS — M9903 Segmental and somatic dysfunction of lumbar region: Secondary | ICD-10-CM

## 2022-07-22 DIAGNOSIS — M9901 Segmental and somatic dysfunction of cervical region: Secondary | ICD-10-CM

## 2022-07-22 DIAGNOSIS — M545 Low back pain, unspecified: Secondary | ICD-10-CM | POA: Diagnosis not present

## 2022-07-22 DIAGNOSIS — M9902 Segmental and somatic dysfunction of thoracic region: Secondary | ICD-10-CM | POA: Diagnosis not present

## 2022-07-22 DIAGNOSIS — S29011A Strain of muscle and tendon of front wall of thorax, initial encounter: Secondary | ICD-10-CM

## 2022-07-22 MED ORDER — ACYCLOVIR 400 MG PO TABS: 400.0000 mg | ORAL_TABLET | Freq: Three times a day (TID) | ORAL | 0 refills | Status: DC

## 2022-07-22 NOTE — Patient Instructions (Addendum)
Tried manipulation today Do prescribed exercises at least 3x a week Acyclovir 400mg  3x a day 5 days prescribed Try to keep hands within peripheral vision when doing movements See you again in 6-8 weeks

## 2022-07-22 NOTE — Assessment & Plan Note (Signed)
   Decision today to treat with OMT was based on Physical Exam  After verbal consent patient was treated with HVLA, ME, FPR techniques in cervical, thoracic, rib areas, all areas are chronic   Patient tolerated the procedure well with improvement in symptoms  Patient given exercises, stretches and lifestyle modifications  See medications in patient instructions if given  Patient will follow up in 4-8 weeks 

## 2022-07-22 NOTE — Assessment & Plan Note (Signed)
Somewhat difficult to assess completely.  Patient did have some signs and symptoms that could be consistent with a potential slipped rib syndrome.  Seems to be more on the left side.  Attempted osteopathic manipulation.  Patient did not know if it made any significant benefit or not.  Patient states that she he is still having difficulty somewhat taking a deep breath and still had the discomfort.  We discussed different scapular exercises for more stability.  We discussed differential includes actually smoldering shingles we have seen with patient having somewhat of a dermatome aspect.  CT of the chest was completely unremarkable which does make [place] feel very optimistic the patient should improve.  If patient does not make improvement I would consider a trial of either possible colchicine, or maybe we should consider the possibility if there is any association with reflux disease.  Patient does take Prilosec regularly and has not noticed a big difference.  Does not appear to be diaphragmatic irritation with patient having a history of reactive airway disease as well.  Patient will keep with his other medications and follow-up with me again in 6 to 8 weeks

## 2022-08-07 ENCOUNTER — Other Ambulatory Visit: Payer: Self-pay | Admitting: Family Medicine

## 2022-08-11 ENCOUNTER — Other Ambulatory Visit: Payer: Self-pay | Admitting: Family Medicine

## 2022-08-12 DIAGNOSIS — M545 Low back pain, unspecified: Secondary | ICD-10-CM | POA: Diagnosis not present

## 2022-08-26 DIAGNOSIS — M545 Low back pain, unspecified: Secondary | ICD-10-CM | POA: Diagnosis not present

## 2022-09-03 NOTE — Progress Notes (Deleted)
[name] [address] [place] [address] Phone: [phone] Subjective:    I'm seeing this patient by the request  of:  Henry Massed, MD  CC:   Henry [name]  07/22/2022 Somewhat difficult to assess completely.  Patient did have some signs and symptoms that could be consistent with a potential slipped rib syndrome.  Seems to be more on the left side.  Attempted osteopathic manipulation.  Patient did not know if it made any significant benefit or not.  Patient states that she he is still having difficulty somewhat taking a deep breath and still had the discomfort.  We discussed different scapular exercises for more stability.  We discussed differential includes actually smoldering shingles we have seen with patient having somewhat of a dermatome aspect.  CT of the chest was completely unremarkable which does make [place] feel very optimistic the patient should improve.  If patient does not make improvement I would consider a trial of either possible colchicine, or maybe we should consider the possibility if there is any association with reflux disease.  Patient does take Prilosec regularly and has not noticed a big difference.  Does not appear to be diaphragmatic irritation with patient having a history of reactive airway disease as well.  Patient will keep with his other medications and follow-up with me again in 6 to 8 weeks   Update 09/09/2022 Henry [name] is a 51 y.o. male coming in with complaint of back and neck pain. OMT 07/22/2022. Also f/u for pec strain. Patient states   Medications patient has been prescribed: acyclovir  Taking:         Reviewed prior external information including notes and imaging from previsou exam, outside providers and external EMR if available.   As well as notes that were available from care everywhere and other healthcare systems.  Past medical history, social, surgical and family history all reviewed in  electronic medical record.  No pertanent information unless stated regarding to the chief complaint.   Past Medical History:  Diagnosis Date   Adjustment disorder with mixed emotional features    Allergic rhinitis    Atypical chest pain 02/2019   Colon cancer screening 03/2021   Cologuard neg 03/2021->rpt 3 yrs.   COVID-19 virus infection 05/2020   Elevated transaminase level    2022.  Extensive lab w/u normal, fibroscan/NASH score borderline elevated.   Sullivan Lone disease    History of pneumonia    Insomnia    Lateral malleolar fracture 09/2020   RIGHT (closed, nondisplaced)   Migraine syndrome    Palpitations    02/2021 Zio patch --No arrythmia. Echo normal.   Peyronie's disease 01/2013   Urology: Vit E 400 U/day   Retinal artery occlusion 01/2012   Cardiac event monitor neg; carotid dopplers and transthoracic echo normal.  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 (hearing loss + BRAO)    Allergies  Allergen Reactions   Food     Green Peppers: GI upset     Review of Systems:  No headache, visual changes, nausea, vomiting, diarrhea, constipation, dizziness, abdominal pain, skin rash, fevers, chills, night sweats, weight loss, swollen lymph nodes, body aches, joint swelling, chest pain, shortness of breath, mood changes. POSITIVE muscle aches  Objective  There were no vitals taken for this visit.   General: No apparent distress alert and oriented x3 mood and affect normal, dressed appropriately.  HEENT: Pupils equal, extraocular movements intact  Respiratory: Patient's speak in full sentences and  does not appear short of breath  Cardiovascular: No lower extremity edema, non tender, no erythema  Gait MSK:  Back   Osteopathic findings  C2 flexed rotated and side bent right C6 flexed rotated and side bent left T3 extended rotated and side bent right inhaled rib T9 extended rotated and side bent left L2 flexed rotated and side bent right Sacrum right on  right       Assessment and Plan:  No problem-specific Assessment & Plan notes found for this encounter.    Nonallopathic problems  Decision today to treat with OMT was based on Physical Exam  After verbal consent patient was treated with HVLA, ME, FPR techniques in cervical, rib, thoracic, lumbar, and sacral  areas  Patient tolerated the procedure well with improvement in symptoms  Patient given exercises, stretches and lifestyle modifications  See medications in patient instructions if given  Patient will follow up in 4-8 weeks             Note: This dictation was prepared with Dragon dictation along with smaller phrase technology. Any transcriptional errors that result from this process are unintentional.

## 2022-09-05 ENCOUNTER — Other Ambulatory Visit: Payer: Self-pay | Admitting: Family Medicine

## 2022-09-08 ENCOUNTER — Encounter: Payer: Self-pay | Admitting: Family Medicine

## 2022-09-08 ENCOUNTER — Other Ambulatory Visit: Payer: Self-pay | Admitting: Family Medicine

## 2022-09-09 ENCOUNTER — Ambulatory Visit: Payer: BC Managed Care – PPO | Admitting: Family Medicine

## 2022-09-09 NOTE — Telephone Encounter (Signed)
RF request for Flovent LOV: 07/07/22 Next ov: 12/12, cancelled by patient Last written: 03/29/21 , historical provider  Please Advise. Med pending

## 2022-09-15 DIAGNOSIS — M545 Low back pain, unspecified: Secondary | ICD-10-CM | POA: Diagnosis not present

## 2022-09-30 DIAGNOSIS — M545 Low back pain, unspecified: Secondary | ICD-10-CM | POA: Diagnosis not present

## 2022-10-13 DIAGNOSIS — M545 Low back pain, unspecified: Secondary | ICD-10-CM | POA: Diagnosis not present

## 2022-11-04 DIAGNOSIS — M545 Low back pain, unspecified: Secondary | ICD-10-CM | POA: Diagnosis not present

## 2022-11-17 ENCOUNTER — Other Ambulatory Visit: Payer: Self-pay | Admitting: Family Medicine

## 2022-12-08 DIAGNOSIS — L814 Other melanin hyperpigmentation: Secondary | ICD-10-CM | POA: Diagnosis not present

## 2022-12-08 DIAGNOSIS — X32XXXS Exposure to sunlight, sequela: Secondary | ICD-10-CM | POA: Diagnosis not present

## 2022-12-08 DIAGNOSIS — L570 Actinic keratosis: Secondary | ICD-10-CM | POA: Diagnosis not present

## 2022-12-08 DIAGNOSIS — L578 Other skin changes due to chronic exposure to nonionizing radiation: Secondary | ICD-10-CM | POA: Diagnosis not present

## 2022-12-08 DIAGNOSIS — D225 Melanocytic nevi of trunk: Secondary | ICD-10-CM | POA: Diagnosis not present

## 2023-01-07 IMAGING — CT CT CHEST HIGH RESOLUTION W/O CM
2 of 5 series · 15 of 36 positions shown, 18 images · non-contrast
Comparison: Chest CT dated June 17, 2020

CLINICAL DATA: Interstitial lung disease

EXAM:
CT CHEST WITHOUT CONTRAST
TECHNIQUE: Multidetector CT imaging of the chest was performed following the
standard protocol without intravenous contrast. High resolution
imaging of the lungs, as well as inspiratory and expiratory imaging,
was performed.

[Series 4: thorax · axial · 0.81mm/px · z∈[-358,-54]mm · 12 of 168 slices shown, 15 images]
[im 8/168  mediastinal]
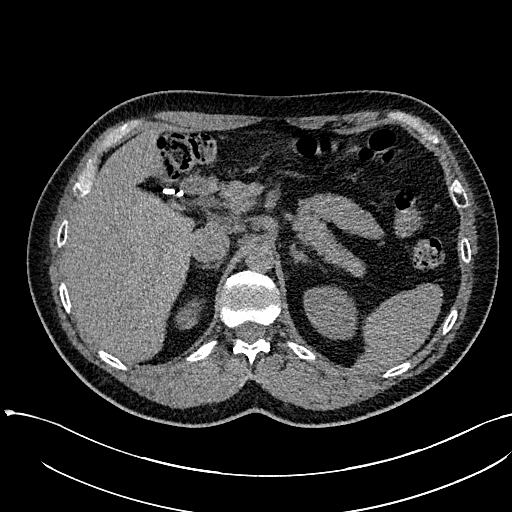
[im 8/168  lung]
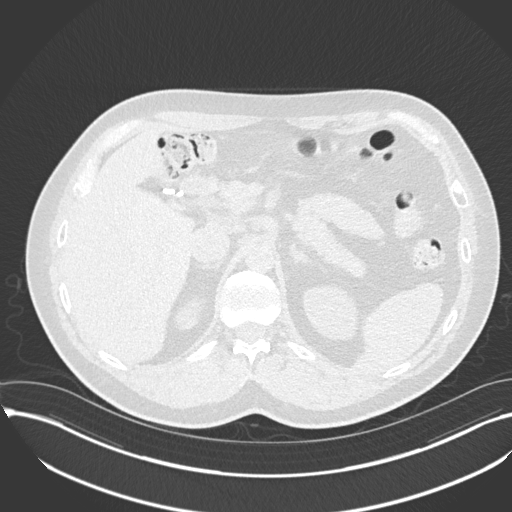
[im 24/168  lung]
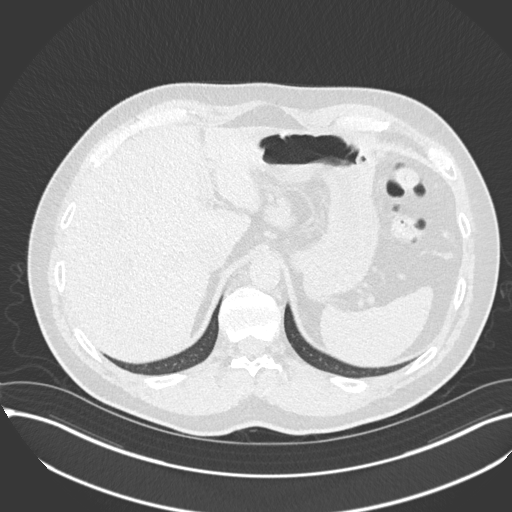
[im 40/168  lung]
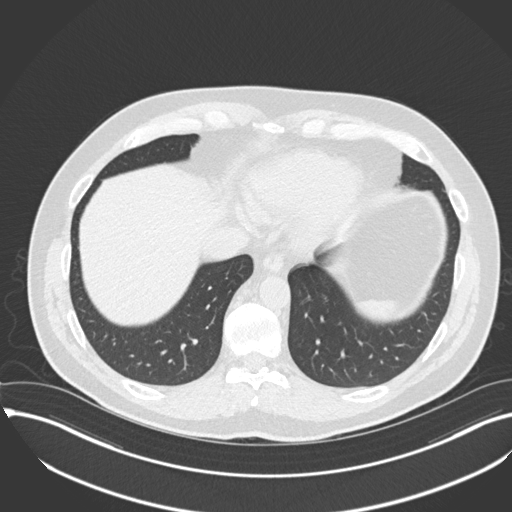
[im 48/168  lung]
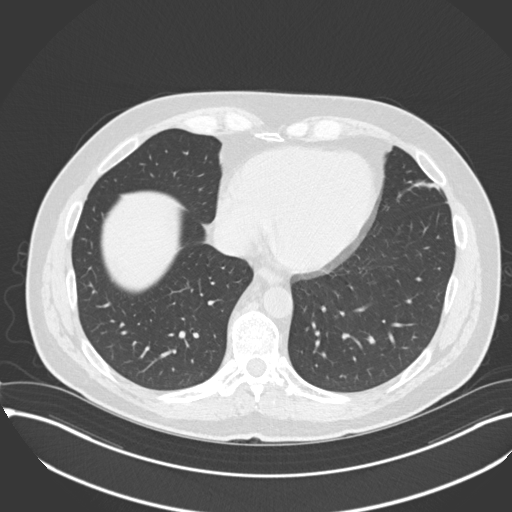
[im 64/168  mediastinal]
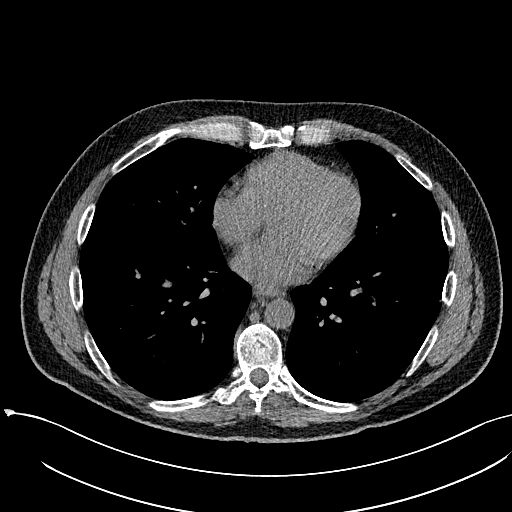
[im 64/168  lung]
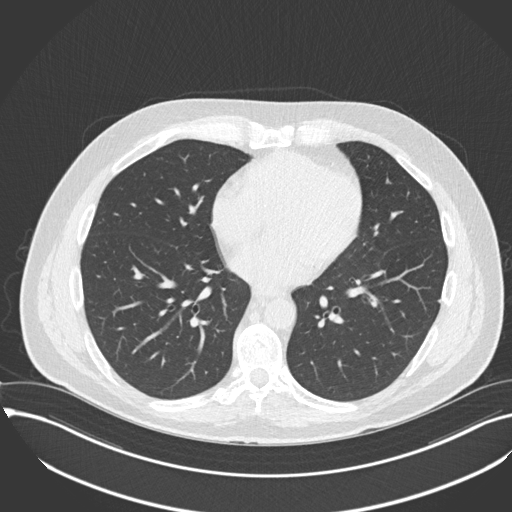
[im 80/168  lung]
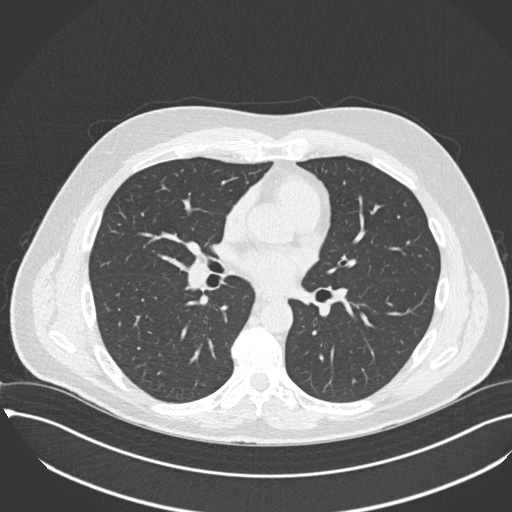
[im 88/168  lung]
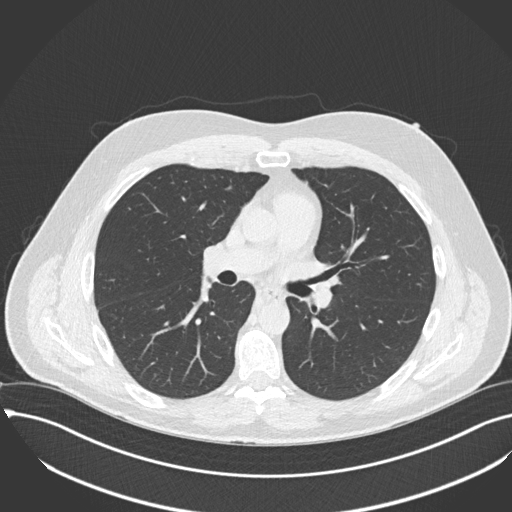
[im 104/168  lung]
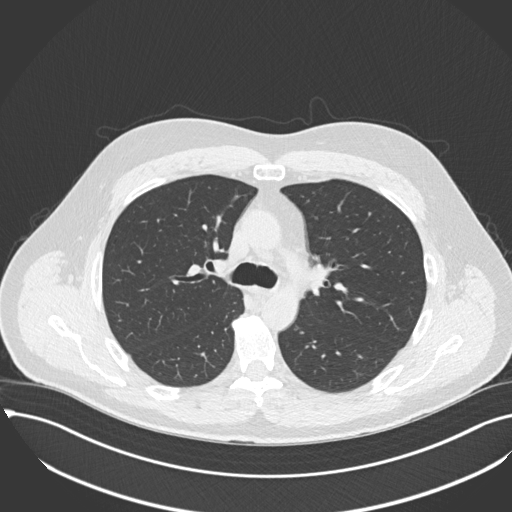
[im 120/168  mediastinal]
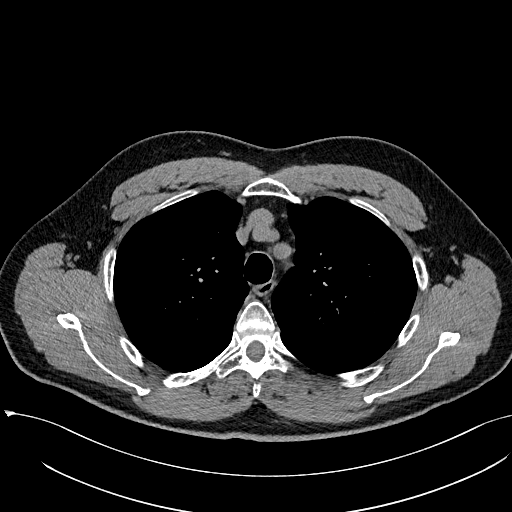
[im 120/168  lung]
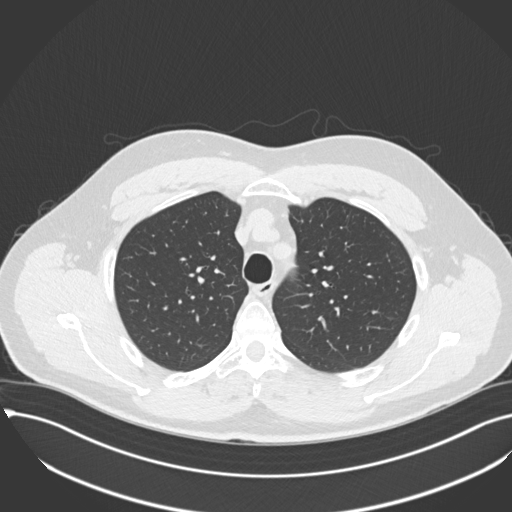
[im 128/168  lung]
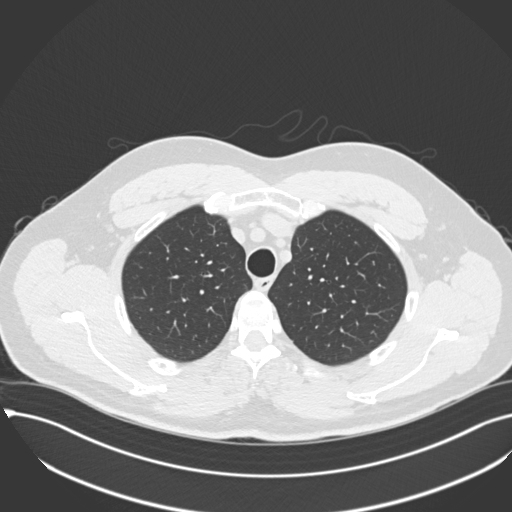
[im 144/168  lung]
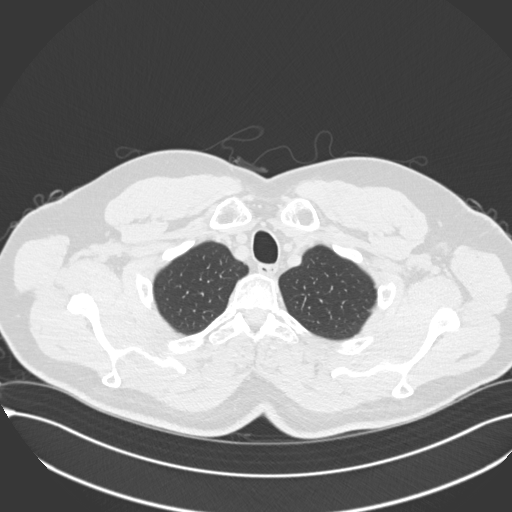
[im 160/168  lung]
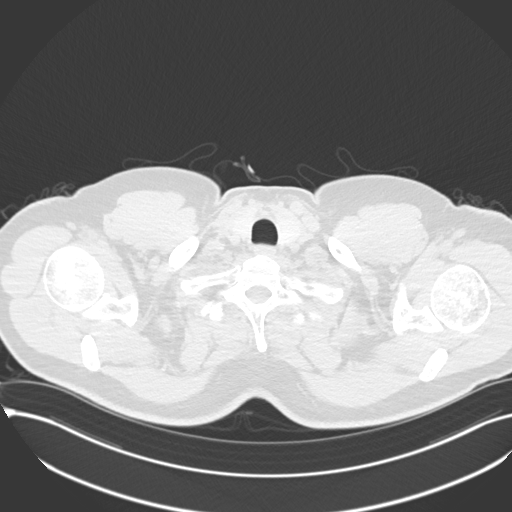

[Series 8: coronal · coronal · 0.68mm/px · 3 of 149 slices shown]
[im 30/149  lung]
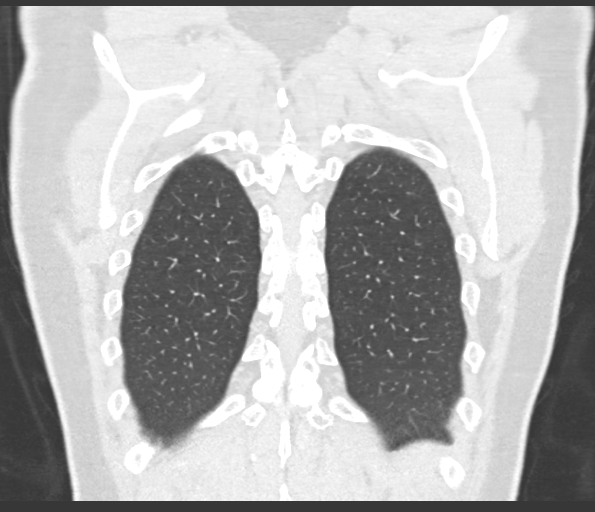
[im 60/149  lung]
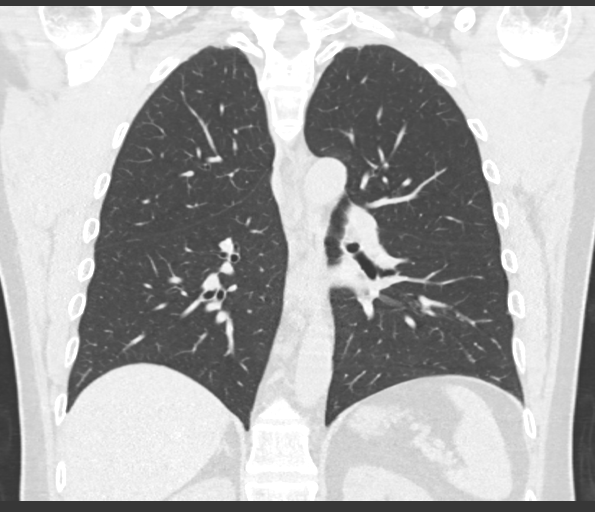
[im 89/149  lung]
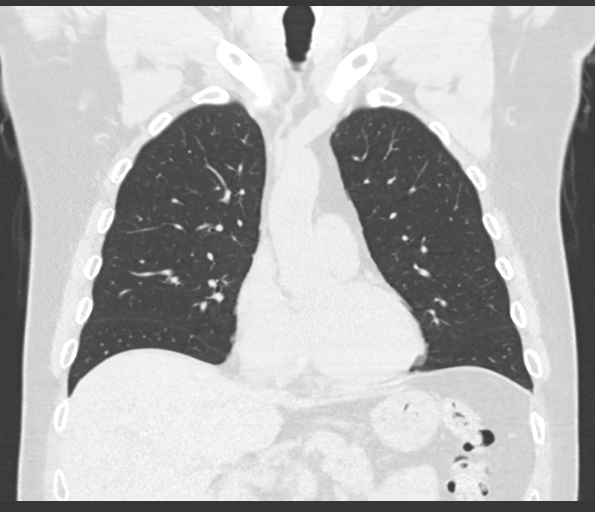

[15 of 36 positions shown; findings below may reference images not displayed]

FINDINGS: Cardiovascular: Normal heart size. No pericardial effusion. No
significant coronary artery calcifications. No significant
atherosclerotic disease of the thoracic aorta.

Mediastinum/Nodes: Esophagus and thyroid are unremarkable. No
pathologically enlarged mediastinal lymph nodes in the chest.

Lungs/Pleura: Lungs are clear. No pleural effusion or pneumothorax.
No reticulations, traction bronchiectasis, or honeycomb change. No
evidence of air trapping.

Upper Abdomen: Cholecystectomy clips.  No acute findings.

Musculoskeletal: No chest wall mass or suspicious bone lesions
identified.
IMPRESSION: No evidence of interstitial lung disease

## 2023-02-04 DIAGNOSIS — H34831 Tributary (branch) retinal vein occlusion, right eye, with macular edema: Secondary | ICD-10-CM | POA: Diagnosis not present

## 2023-02-04 DIAGNOSIS — H43822 Vitreomacular adhesion, left eye: Secondary | ICD-10-CM | POA: Diagnosis not present

## 2023-02-04 DIAGNOSIS — H3582 Retinal ischemia: Secondary | ICD-10-CM | POA: Diagnosis not present

## 2023-02-04 DIAGNOSIS — H31091 Other chorioretinal scars, right eye: Secondary | ICD-10-CM | POA: Diagnosis not present

## 2023-02-18 ENCOUNTER — Ambulatory Visit: Payer: Self-pay | Admitting: Allergy and Immunology

## 2023-03-10 ENCOUNTER — Ambulatory Visit: Payer: BC Managed Care – PPO | Admitting: Allergy and Immunology

## 2023-03-17 ENCOUNTER — Encounter: Payer: Self-pay | Admitting: Allergy and Immunology

## 2023-03-17 ENCOUNTER — Other Ambulatory Visit: Payer: Self-pay

## 2023-03-17 ENCOUNTER — Ambulatory Visit: Payer: BC Managed Care – PPO | Admitting: Allergy and Immunology

## 2023-03-17 VITALS — BP 104/76 | HR 73 | Temp 98.3°F | Resp 18 | Ht 72.75 in | Wt 229.4 lb

## 2023-03-17 DIAGNOSIS — J4530 Mild persistent asthma, uncomplicated: Secondary | ICD-10-CM | POA: Diagnosis not present

## 2023-03-17 DIAGNOSIS — J3089 Other allergic rhinitis: Secondary | ICD-10-CM

## 2023-03-17 DIAGNOSIS — J301 Allergic rhinitis due to pollen: Secondary | ICD-10-CM | POA: Diagnosis not present

## 2023-03-17 MED ORDER — AIRSUPRA 90-80 MCG/ACT IN AERO: 2.0000 | INHALATION_SPRAY | Freq: Four times a day (QID) | RESPIRATORY_TRACT | 0 refills | Status: DC | PRN

## 2023-03-17 NOTE — Patient Instructions (Addendum)
  1. Treat and prevent inflammation of airway:   A. Asmanex 100 - 2 inhalations 1-2 times per day w/ spacer (empty lungs)  B. Xhance - 1-2 sprays each nostril 1-2 times per day  2. If needed:   A. Azelastine - 1-2 sprays each nostril 1-2 times per day  B. OTC antihistamine   C. Airsupra - 2 inhalations every 6 hours  3. During flare up, increase Asmanex to 2 inhalations 4 times per day  4. For this recent viral induced flare, prednisone 10 mg daily x 10 days  5. Plan for fall flu vaccine  6. Return to clinic in 6 months or earlier if needed
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
## 2023-03-18 ENCOUNTER — Encounter: Payer: Self-pay | Admitting: Allergy and Immunology

## 2023-03-18 DIAGNOSIS — L2389 Allergic contact dermatitis due to other agents: Secondary | ICD-10-CM | POA: Diagnosis not present

## 2023-03-18 DIAGNOSIS — L400 Psoriasis vulgaris: Secondary | ICD-10-CM | POA: Diagnosis not present

## 2023-03-26 ENCOUNTER — Other Ambulatory Visit: Payer: Self-pay

## 2023-03-26 MED ORDER — AZELASTINE HCL 137 MCG/SPRAY NA SOLN: NASAL | 1 refills | Status: DC

## 2023-05-15 ENCOUNTER — Other Ambulatory Visit: Payer: Self-pay | Admitting: Family Medicine

## 2023-06-08 ENCOUNTER — Encounter: Payer: Self-pay | Admitting: Allergy and Immunology

## 2023-06-08 ENCOUNTER — Other Ambulatory Visit: Payer: Self-pay | Admitting: *Deleted

## 2023-06-08 MED ORDER — ASMANEX HFA 100 MCG/ACT IN AERO: 2.0000 | INHALATION_SPRAY | Freq: Two times a day (BID) | RESPIRATORY_TRACT | 1 refills | Status: DC

## 2023-08-05 ENCOUNTER — Other Ambulatory Visit: Payer: Self-pay | Admitting: Allergy and Immunology

## 2023-09-15 ENCOUNTER — Ambulatory Visit: Payer: BC Managed Care – PPO | Admitting: Allergy and Immunology

## 2023-10-27 ENCOUNTER — Encounter: Payer: Self-pay | Admitting: Allergy and Immunology

## 2023-10-27 ENCOUNTER — Ambulatory Visit: Payer: BC Managed Care – PPO | Admitting: Allergy and Immunology

## 2023-10-27 ENCOUNTER — Other Ambulatory Visit: Payer: Self-pay

## 2023-10-27 VITALS — BP 118/76 | HR 64 | Temp 97.9°F | Resp 16 | Ht 74.0 in | Wt 227.5 lb

## 2023-10-27 DIAGNOSIS — J301 Allergic rhinitis due to pollen: Secondary | ICD-10-CM | POA: Diagnosis not present

## 2023-10-27 DIAGNOSIS — J4530 Mild persistent asthma, uncomplicated: Secondary | ICD-10-CM

## 2023-10-27 DIAGNOSIS — J3089 Other allergic rhinitis: Secondary | ICD-10-CM | POA: Diagnosis not present

## 2023-10-27 MED ORDER — AIRSUPRA 90-80 MCG/ACT IN AERO: 2.0000 | INHALATION_SPRAY | Freq: Four times a day (QID) | RESPIRATORY_TRACT | 0 refills | Status: DC | PRN

## 2023-10-27 MED ORDER — ASMANEX HFA 100 MCG/ACT IN AERO: INHALATION_SPRAY | RESPIRATORY_TRACT | 1 refills | Status: DC

## 2023-10-27 MED ORDER — XHANCE 93 MCG/ACT NA EXHU: INHALANT_SUSPENSION | NASAL | 3 refills | Status: DC

## 2023-10-27 MED ORDER — AZELASTINE HCL 137 MCG/SPRAY NA SOLN: NASAL | 1 refills | Status: DC

## 2023-10-27 NOTE — Patient Instructions (Addendum)
  1. Treat and prevent inflammation of airway:   A. Asmanex 100 - 2 inhalations 1-2 times per day w/ spacer    B. Xhance - 1-2 sprays each nostril 1-2 times per day  2. If needed:   A. Azelastine - 1-2 sprays each nostril 1-2 times per day  B. OTC antihistamine   C. Airsupra - 2 inhalations every 6 hours  3. During flare up, increase Asmanex to 2 inhalations 4 times per day  4. Influenza = Tamiflu. Covid = Paxlovid  5. Return to clinic in 6 months or earlier if needed
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
## 2023-10-28 ENCOUNTER — Encounter: Payer: Self-pay | Admitting: Allergy and Immunology

## 2023-10-29 ENCOUNTER — Telehealth: Payer: Self-pay

## 2023-10-29 ENCOUNTER — Other Ambulatory Visit (HOSPITAL_COMMUNITY): Payer: Self-pay

## 2023-10-29 NOTE — Telephone Encounter (Signed)
*  Asthma/Allergy  Pharmacy Patient Advocate Encounter  Received notification from [facility] that Prior Authorization for [facility] 93MCG/ACT exhaler suspension  has been APPROVED from 10/29/2023 to 10/27/2024. Ran test claim, Copay is $24.99. This test claim was processed through [facility]- copay amounts may vary at other pharmacies due to pharmacy/plan contracts, or as the patient moves through the different stages of their insurance plan.   PA #/Case ID/Reference #: Henry [name]

## 2023-11-29 ENCOUNTER — Encounter: Payer: Self-pay | Admitting: Family Medicine

## 2023-12-02 ENCOUNTER — Ambulatory Visit: Admitting: Family Medicine

## 2023-12-02 ENCOUNTER — Encounter: Payer: Self-pay | Admitting: Family Medicine

## 2023-12-02 VITALS — BP 106/71 | HR 66 | Ht 74.0 in | Wt 224.0 lb

## 2023-12-02 DIAGNOSIS — N4889 Other specified disorders of penis: Secondary | ICD-10-CM | POA: Diagnosis not present

## 2023-12-02 DIAGNOSIS — N486 Induration penis plastica: Secondary | ICD-10-CM | POA: Diagnosis not present

## 2023-12-02 MED ORDER — COLCHICINE 0.6 MG PO TABS: 0.6000 mg | ORAL_TABLET | Freq: Two times a day (BID) | ORAL | 1 refills | Status: DC
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
## 2023-12-07 DIAGNOSIS — L814 Other melanin hyperpigmentation: Secondary | ICD-10-CM | POA: Diagnosis not present

## 2023-12-07 DIAGNOSIS — L400 Psoriasis vulgaris: Secondary | ICD-10-CM | POA: Diagnosis not present

## 2023-12-07 DIAGNOSIS — L570 Actinic keratosis: Secondary | ICD-10-CM | POA: Diagnosis not present

## 2023-12-07 DIAGNOSIS — H35051 Retinal neovascularization, unspecified, right eye: Secondary | ICD-10-CM | POA: Diagnosis not present

## 2023-12-07 DIAGNOSIS — L738 Other specified follicular disorders: Secondary | ICD-10-CM | POA: Diagnosis not present

## 2023-12-07 DIAGNOSIS — D225 Melanocytic nevi of trunk: Secondary | ICD-10-CM | POA: Diagnosis not present

## 2023-12-23 ENCOUNTER — Encounter: Admitting: Family Medicine

## 2023-12-27 ENCOUNTER — Other Ambulatory Visit: Payer: Self-pay | Admitting: Family Medicine

## 2023-12-31 DIAGNOSIS — N486 Induration penis plastica: Secondary | ICD-10-CM | POA: Diagnosis not present

## 2024-01-25 ENCOUNTER — Encounter: Admitting: Family Medicine

## 2024-02-08 ENCOUNTER — Encounter: Payer: Self-pay | Admitting: Family Medicine

## 2024-02-08 ENCOUNTER — Ambulatory Visit (INDEPENDENT_AMBULATORY_CARE_PROVIDER_SITE_OTHER): Admitting: Family Medicine

## 2024-02-08 VITALS — BP 110/71 | HR 64 | Temp 97.9°F | Ht 74.0 in | Wt 221.6 lb

## 2024-02-08 DIAGNOSIS — R7401 Elevation of levels of liver transaminase levels: Secondary | ICD-10-CM

## 2024-02-08 DIAGNOSIS — Z800 Family history of malignant neoplasm of digestive organs: Secondary | ICD-10-CM | POA: Diagnosis not present

## 2024-02-08 DIAGNOSIS — Z1211 Encounter for screening for malignant neoplasm of colon: Secondary | ICD-10-CM | POA: Diagnosis not present

## 2024-02-08 DIAGNOSIS — Z125 Encounter for screening for malignant neoplasm of prostate: Secondary | ICD-10-CM

## 2024-02-08 DIAGNOSIS — Z0000 Encounter for general adult medical examination without abnormal findings: Secondary | ICD-10-CM

## 2024-02-08 NOTE — Patient Instructions (Signed)
 Health Maintenance, Male  Adopting a healthy lifestyle and getting preventive care are important in promoting health and wellness. Ask your health care provider about:  The right schedule for you to have regular tests and exams.  Things you can do on your own to prevent diseases and keep yourself healthy.  What should I know about diet, weight, and exercise?  Eat a healthy diet    Eat a diet that includes plenty of vegetables, fruits, low-fat dairy products, and lean protein.  Do not eat a lot of foods that are high in solid fats, added sugars, or sodium.  Maintain a healthy weight  Body mass index (BMI) is a measurement that can be used to identify possible weight problems. It estimates body fat based on height and weight. Your health care provider can help determine your BMI and help you achieve or maintain a healthy weight.  Get regular exercise  Get regular exercise. This is one of the most important things you can do for your health. Most adults should:  Exercise for at least 150 minutes each week. The exercise should increase your heart rate and make you sweat (moderate-intensity exercise).  Do strengthening exercises at least twice a week. This is in addition to the moderate-intensity exercise.  Spend less time sitting. Even light physical activity can be beneficial.  Watch cholesterol and blood lipids  Have your blood tested for lipids and cholesterol at 53 years of age, then have this test every 5 years.  You may need to have your cholesterol levels checked more often if:  Your lipid or cholesterol levels are high.  You are older than 53 years of age.  You are at high risk for heart disease.  What should I know about cancer screening?  Many types of cancers can be detected early and may often be prevented. Depending on your health history and family history, you may need to have cancer screening at various ages. This may include screening for:  Colorectal cancer.  Prostate cancer.  Skin cancer.  Lung  cancer.  What should I know about heart disease, diabetes, and high blood pressure?  Blood pressure and heart disease  High blood pressure causes heart disease and increases the risk of stroke. This is more likely to develop in people who have high blood pressure readings or are overweight.  Talk with your health care provider about your target blood pressure readings.  Have your blood pressure checked:  Every 3-5 years if you are 9-95 years of age.  Every year if you are 85 years old or older.  If you are between the ages of 29 and 29 and are a current or former smoker, ask your health care provider if you should have a one-time screening for abdominal aortic aneurysm (AAA).  Diabetes  Have regular diabetes screenings. This checks your fasting blood sugar level. Have the screening done:  Once every three years after age 23 if you are at a normal weight and have a low risk for diabetes.  More often and at a younger age if you are overweight or have a high risk for diabetes.  What should I know about preventing infection?  Hepatitis B  If you have a higher risk for hepatitis B, you should be screened for this virus. Talk with your health care provider to find out if you are at risk for hepatitis B infection.  Hepatitis C  Blood testing is recommended for:  Everyone born from [birth year] through 1965.  Anyone  with known risk factors for hepatitis C.  Sexually transmitted infections (STIs)  You should be screened each year for STIs, including gonorrhea and chlamydia, if:  You are sexually active and are younger than 53 years of age.  You are older than 53 years of age and your health care provider tells you that you are at risk for this type of infection.  Your sexual activity has changed since you were last screened, and you are at increased risk for chlamydia or gonorrhea. Ask your health care provider if you are at risk.  Ask your health care provider about whether you are at high risk for HIV. Your health care provider  may recommend a prescription medicine to help prevent HIV infection. If you choose to take medicine to prevent HIV, you should first get tested for HIV. You should then be tested every 3 months for as long as you are taking the medicine.  Follow these instructions at home:  Alcohol use  Do not drink alcohol if your health care provider tells you not to drink.  If you drink alcohol:  Limit how much you have to 0-2 drinks a day.  Know how much alcohol is in your drink. In the [place] one drink equals one 12 oz bottle of beer (355 mL), one 5 oz glass of wine (148 mL), or one 1 oz glass of hard liquor (44 mL).  Lifestyle  Do not use any products that contain nicotine or tobacco. These products include cigarettes, chewing tobacco, and vaping devices, such as e-cigarettes. If you need help quitting, ask your health care provider.  Do not use street drugs.  Do not share needles.  Ask your health care provider for help if you need support or information about quitting drugs.  General instructions  Schedule regular health, dental, and eye exams.  Stay current with your vaccines.  Tell your health care provider if:  You often feel depressed.  You have ever been abused or do not feel safe at home.  Summary  Adopting a healthy lifestyle and getting preventive care are important in promoting health and wellness.  Follow your health care provider's instructions about healthy diet, exercising, and getting tested or screened for diseases.  Follow your health care provider's instructions on monitoring your cholesterol and blood pressure.  This information is not intended to replace advice given to you by your health care provider. Make sure you discuss any questions you have with your health care provider.  Document Revised: 02/04/2021 Document Reviewed: 02/04/2021  Elsevier Patient Education  2024 [employer].
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
## 2024-02-09 LAB — COMPREHENSIVE METABOLIC PANEL WITH GFR
ALT: 21 U/L (ref 0–53)
AST: 18 U/L (ref 0–37)
Albumin: 4.7 g/dL (ref 3.5–5.2)
Alkaline Phosphatase: 61 U/L (ref 39–117)
BUN: 15 mg/dL (ref 6–23)
CO2: 28 meq/L (ref 19–32)
Calcium: 9.8 mg/dL (ref 8.4–10.5)
Chloride: 103 meq/L (ref 96–112)
Creatinine, Ser: 0.84 mg/dL (ref 0.40–1.50)
GFR: 100.33 mL/min (ref >60.00)
Glucose, Bld: 93 mg/dL (ref 70–99)
Potassium: 4.5 meq/L (ref 3.5–5.1)
Sodium: 140 meq/L (ref 135–145)
Total Bilirubin: 1.8 mg/dL — ABNORMAL HIGH (ref 0.2–1.2)
Total Protein: 7 g/dL (ref 6.0–8.3)

## 2024-02-09 LAB — LIPID PANEL
Cholesterol: 213 mg/dL — ABNORMAL HIGH (ref 0–200)
HDL: 68.3 mg/dL (ref >39.00)
LDL Cholesterol: 135 mg/dL — ABNORMAL HIGH (ref 0–99)
NonHDL: 144.87
Total CHOL/HDL Ratio: 3
Triglycerides: 51 mg/dL (ref 0.0–149.0)
VLDL: 10.2 mg/dL (ref 0.0–40.0)

## 2024-02-09 LAB — CBC WITH DIFFERENTIAL/PLATELET
Basophils Absolute: 0.1 10*3/uL (ref 0.0–0.1)
Basophils Relative: 1.3 % (ref 0.0–3.0)
Eosinophils Absolute: 0.1 10*3/uL (ref 0.0–0.7)
Eosinophils Relative: 0.9 % (ref 0.0–5.0)
HCT: 46.8 % (ref 39.0–52.0)
Hemoglobin: 15.5 g/dL (ref 13.0–17.0)
Lymphocytes Relative: 30.5 % (ref 12.0–46.0)
Lymphs Abs: 1.9 10*3/uL (ref 0.7–4.0)
MCHC: 33.1 g/dL (ref 30.0–36.0)
MCV: 89.8 fl (ref 78.0–100.0)
Monocytes Absolute: 0.4 10*3/uL (ref 0.1–1.0)
Monocytes Relative: 6.7 % (ref 3.0–12.0)
Neutro Abs: 3.8 10*3/uL (ref 1.4–7.7)
Neutrophils Relative %: 60.6 % (ref 43.0–77.0)
Platelets: 219 10*3/uL (ref 150.0–400.0)
RBC: 5.21 Mil/uL (ref 4.22–5.81)
RDW: 13.8 % (ref 11.5–15.5)
WBC: 6.3 10*3/uL (ref 4.0–10.5)

## 2024-02-09 LAB — LIPASE: Lipase: 26 U/L (ref 11.0–59.0)

## 2024-02-09 LAB — TSH: TSH: 1.21 u[IU]/mL (ref 0.35–5.50)

## 2024-02-09 LAB — PSA: PSA: 0.52 ng/mL (ref 0.10–4.00)

## 2024-02-10 ENCOUNTER — Ambulatory Visit: Payer: Self-pay | Admitting: Family Medicine

## 2024-02-23 DIAGNOSIS — H2513 Age-related nuclear cataract, bilateral: Secondary | ICD-10-CM | POA: Diagnosis not present

## 2024-02-23 DIAGNOSIS — H43822 Vitreomacular adhesion, left eye: Secondary | ICD-10-CM | POA: Diagnosis not present

## 2024-02-23 DIAGNOSIS — H31091 Other chorioretinal scars, right eye: Secondary | ICD-10-CM | POA: Diagnosis not present

## 2024-02-23 DIAGNOSIS — H34231 Retinal artery branch occlusion, right eye: Secondary | ICD-10-CM | POA: Diagnosis not present

## 2024-04-19 ENCOUNTER — Ambulatory Visit: Payer: BC Managed Care – PPO | Admitting: Allergy and Immunology

## 2024-04-19 ENCOUNTER — Other Ambulatory Visit: Payer: Self-pay

## 2024-04-19 VITALS — BP 102/82 | HR 65 | Temp 98.4°F | Resp 18 | Ht 73.75 in | Wt 232.7 lb

## 2024-04-19 DIAGNOSIS — J3089 Other allergic rhinitis: Secondary | ICD-10-CM | POA: Diagnosis not present

## 2024-04-19 DIAGNOSIS — J301 Allergic rhinitis due to pollen: Secondary | ICD-10-CM

## 2024-04-19 DIAGNOSIS — J4530 Mild persistent asthma, uncomplicated: Secondary | ICD-10-CM

## 2024-04-19 MED ORDER — AZELASTINE HCL 137 MCG/SPRAY NA SOLN: 2.0000 | Freq: Two times a day (BID) | NASAL | 3 refills | Status: AC

## 2024-04-19 MED ORDER — XHANCE 93 MCG/ACT NA EXHU: 2.0000 | INHALANT_SUSPENSION | Freq: Two times a day (BID) | NASAL | 3 refills | Status: DC

## 2024-04-19 MED ORDER — OPTICHAMBER DIAMOND-LG MASK DEVI: 1.0000 | 1 refills | Status: AC

## 2024-04-19 MED ORDER — LEVOCETIRIZINE DIHYDROCHLORIDE 5 MG PO TABS
5.0000 mg | ORAL_TABLET | Freq: Every day | ORAL | 1 refills | Status: AC | PRN
Start: 2024-04-19 — End: ?

## 2024-04-19 MED ORDER — AIRSUPRA 90-80 MCG/ACT IN AERO: 2.0000 | INHALATION_SPRAY | Freq: Four times a day (QID) | RESPIRATORY_TRACT | 0 refills | Status: AC | PRN

## 2024-04-19 MED ORDER — ASMANEX HFA 100 MCG/ACT IN AERO: 2.0000 | INHALATION_SPRAY | Freq: Two times a day (BID) | RESPIRATORY_TRACT | 3 refills | Status: AC

## 2024-04-19 MED ORDER — FEXOFENADINE HCL 180 MG PO TABS: 180.0000 mg | ORAL_TABLET | Freq: Every day | ORAL | 1 refills | Status: AC | PRN

## 2024-04-19 MED ORDER — CETIRIZINE HCL 10 MG PO TABS: 10.0000 mg | ORAL_TABLET | Freq: Every day | ORAL | 1 refills | Status: AC | PRN

## 2024-04-19 NOTE — Patient Instructions (Addendum)
  1. Treat and prevent inflammation of airway:   A. Asmanex  100 - 2 inhalations 1-2 times per day w/ spacer    B. Xhance  - 1-2 sprays each nostril 1-2 times per day  2. If needed:   A. Azelastine  - 1-2 sprays each nostril 1-2 times per day  B. OTC antihistamine   C. Airsupra  - 2 inhalations every 6 hours  3. During flare up, increase Asmanex  to 2 inhalations 4 times per day  4. Influenza = Tamiflu. Covid = Paxlovid  5. Return to clinic in 12 months or earlier if needed
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
## 2024-04-20 ENCOUNTER — Encounter: Payer: Self-pay | Admitting: Allergy and Immunology

## 2024-04-27 DIAGNOSIS — Z1211 Encounter for screening for malignant neoplasm of colon: Secondary | ICD-10-CM | POA: Diagnosis not present

## 2024-05-03 ENCOUNTER — Encounter: Payer: Self-pay | Admitting: Family Medicine

## 2024-05-03 LAB — COLOGUARD: COLOGUARD: NEGATIVE

## 2024-05-04 NOTE — Progress Notes (Signed)
 Pt advised.

## 2024-05-19 ENCOUNTER — Other Ambulatory Visit: Payer: Self-pay | Admitting: Family Medicine

## 2024-05-20 ENCOUNTER — Encounter: Payer: Self-pay | Admitting: Allergy and Immunology

## 2024-05-26 MED ORDER — XHANCE 93 MCG/ACT NA EXHU: 2.0000 | INHALANT_SUSPENSION | Freq: Two times a day (BID) | NASAL | 3 refills | Status: AC

## 2025-04-18 ENCOUNTER — Ambulatory Visit: Admitting: Allergy and Immunology
